# Patient Record
Sex: Male | Born: 1980
Health system: Southern US, Community
[De-identification: ages and names within clinical notes are randomized; demographics above are authoritative.]

---

## 2012-12-27 ENCOUNTER — Emergency Department (HOSPITAL_COMMUNITY)
Admission: EM | Admit: 2012-12-27 | Discharge: 2012-12-27 | Disposition: A | Discharge status: Home / Self Care | Payer: No Typology Code available for payment source | Attending: Emergency Medicine | Admitting: Emergency Medicine

## 2012-12-27 ENCOUNTER — Emergency Department (HOSPITAL_COMMUNITY): Payer: Self-pay

## 2012-12-27 ENCOUNTER — Encounter (HOSPITAL_COMMUNITY): Payer: Self-pay | Admitting: *Deleted

## 2012-12-27 ENCOUNTER — Emergency Department (HOSPITAL_COMMUNITY): Payer: No Typology Code available for payment source

## 2012-12-27 DIAGNOSIS — S60229A Contusion of unspecified hand, initial encounter: Secondary | ICD-10-CM

## 2012-12-27 DIAGNOSIS — S0993XA Unspecified injury of face, initial encounter: Secondary | ICD-10-CM | POA: Insufficient documentation

## 2012-12-27 DIAGNOSIS — S199XXA Unspecified injury of neck, initial encounter: Secondary | ICD-10-CM | POA: Insufficient documentation

## 2012-12-27 DIAGNOSIS — F17200 Nicotine dependence, unspecified, uncomplicated: Secondary | ICD-10-CM | POA: Insufficient documentation

## 2012-12-27 DIAGNOSIS — Y9389 Activity, other specified: Secondary | ICD-10-CM | POA: Insufficient documentation

## 2012-12-27 DIAGNOSIS — Y92410 Unspecified street and highway as the place of occurrence of the external cause: Secondary | ICD-10-CM | POA: Insufficient documentation

## 2012-12-27 MED ORDER — HYDROCODONE-ACETAMINOPHEN 5-325 MG PO TABS: 1.0000 | ORAL_TABLET | Freq: Four times a day (QID) | ORAL | Status: DC | PRN

## 2012-12-27 MED ORDER — OXYCODONE-ACETAMINOPHEN 5-325 MG PO TABS
1.0000 | ORAL_TABLET | Freq: Once | ORAL | Status: AC
  Administered 2012-12-27: 1 via ORAL
  Filled 2012-12-27: qty 1

## 2012-12-27 MED ORDER — IBUPROFEN 800 MG PO TABS: 800.0000 mg | ORAL_TABLET | Freq: Three times a day (TID) | ORAL | Status: DC | PRN
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
## 2012-12-27 NOTE — ED Notes (Signed)
Pt returned from xray
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
## 2012-12-28 NOTE — ED Provider Notes (Signed)
Medical screening examination/treatment/procedure(s) were performed by non-physician practitioner and as supervising physician I was immediately available for consultation/collaboration.  Jeronica Stlouis T Zeena Starkel, MD 12/28/12 1523 

## 2014-03-28 ENCOUNTER — Emergency Department (HOSPITAL_BASED_OUTPATIENT_CLINIC_OR_DEPARTMENT_OTHER)
Admission: EM | Admit: 2014-03-28 | Discharge: 2014-03-28 | Disposition: A | Discharge status: Home / Self Care | Payer: Self-pay | Attending: Emergency Medicine | Admitting: Emergency Medicine

## 2014-03-28 ENCOUNTER — Encounter (HOSPITAL_BASED_OUTPATIENT_CLINIC_OR_DEPARTMENT_OTHER): Payer: Self-pay | Admitting: Emergency Medicine

## 2014-03-28 ENCOUNTER — Emergency Department (HOSPITAL_BASED_OUTPATIENT_CLINIC_OR_DEPARTMENT_OTHER): Payer: Self-pay

## 2014-03-28 DIAGNOSIS — F17200 Nicotine dependence, unspecified, uncomplicated: Secondary | ICD-10-CM | POA: Insufficient documentation

## 2014-03-28 DIAGNOSIS — Y9389 Activity, other specified: Secondary | ICD-10-CM | POA: Insufficient documentation

## 2014-03-28 DIAGNOSIS — S62309A Unspecified fracture of unspecified metacarpal bone, initial encounter for closed fracture: Secondary | ICD-10-CM | POA: Insufficient documentation

## 2014-03-28 DIAGNOSIS — Y9289 Other specified places as the place of occurrence of the external cause: Secondary | ICD-10-CM | POA: Insufficient documentation

## 2014-03-28 DIAGNOSIS — S62304A Unspecified fracture of fourth metacarpal bone, right hand, initial encounter for closed fracture: Secondary | ICD-10-CM

## 2014-03-28 DIAGNOSIS — IMO0002 Reserved for concepts with insufficient information to code with codable children: Secondary | ICD-10-CM | POA: Insufficient documentation
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
## 2014-03-28 NOTE — Discharge Instructions (Signed)
Hand Fracture, Metacarpals °Fractures of metacarpals are breaks in the bones of the hand. They extend from the knuckles to the wrist. These bones can undergo many types of fractures. There are different ways of treating these fractures, all of which may be correct. °TREATMENT  °Hand fractures can be treated with:  °· Non-reduction - The fracture is casted without changing the positions of the fracture (bone pieces) involved. This fracture is usually left in a cast for 4 to 6 weeks or as your caregiver thinks necessary. °· Closed reduction - The bones are moved back into position without surgery and then casted. °· ORIF (open reduction and internal fixation) - The fracture site is opened and the bone pieces are fixed into place with some type of hardware, such as screws, etc. They are then casted. °Your caregiver will discuss the type of fracture you have and the treatment that should be best for that problem. If surgery is chosen, let your caregivers know about the following.  °LET YOUR CAREGIVERS KNOW ABOUT: °· Allergies. °· Medications you are taking, including herbs, eye drops, over the counter medications, and creams. °· Use of steroids (by mouth or creams). °· Previous problems with anesthetics or novocaine. °· Possibility of pregnancy. °· History of blood clots (thrombophlebitis). °· History of bleeding or blood problems. °· Previous surgeries. °· Other health problems. °AFTER THE PROCEDURE °After surgery, you will be taken to the recovery area where a nurse will watch and check your progress. Once you are awake, stable, and taking fluids well, barring other problems, you'll be allowed to go home. Once home, an ice pack applied to your operative site may help with pain and keep the swelling down. °HOME CARE INSTRUCTIONS  °· Follow your caregiver's instructions as to activities, exercises, physical therapy, and driving a car. °· Daily exercise is helpful for keeping range of motion and strength. Exercise as  instructed. °· To lessen swelling, keep the injured hand elevated above the level of your heart as much as possible. °· Apply ice to the injury for 15-20 minutes each hour while awake for the first 2 days. Put the ice in a plastic bag and place a thin towel between the bag of ice and your cast. °· Move the fingers of your casted hand several times a day. °· If a plaster or fiberglass cast was applied: °· Do not try to scratch the skin under the cast using a sharp or pointed object. °· Check the skin around the cast every day. You may put lotion on red or sore areas. °· Keep your cast dry. Your cast can be protected during bathing with a plastic bag. Do not put your cast into the water. °· If a plaster splint was applied: °· Wear your splint for as long as directed by your caregiver or until seen again. °· Do not get your splint wet. Protect it during bathing with a plastic bag. °· You may loosen the elastic bandage around the splint if your fingers start to get numb, tingle, get cold or turn blue. °· Do not put pressure on your cast or splint; this may cause it to break. Especially, do not lean plaster casts on hard surfaces for 24 hours after application. °· Take medications as directed by your caregiver. °· Only take over-the-counter or prescription medicines for pain, discomfort, or fever as directed by your caregiver. °· Follow-up as provided by your caregiver. This is very important in order to avoid permanent injury or disability and chronic   take over-the-counter or prescription medicines for pain, discomfort, or fever as directed by your caregiver.   Follow-up as provided by your caregiver. This is very important in order to avoid permanent injury or disability and chronic pain.  SEEK MEDICAL CARE IF:    Increased bleeding (more than a small spot) from beneath your cast or splint if there is beneath the cast as with an open reduction.   Redness, swelling, or increasing pain in the wound or from beneath your cast or splint.   Pus coming from wound or from beneath your cast or splint.   An unexplained oral temperature above 102 F (38.9 C) develops, or as your caregiver suggests.   A foul smell coming from the wound or dressing or from  beneath your cast or splint.   You have a problem moving any of your fingers.  SEEK IMMEDIATE MEDICAL CARE IF:    You develop a rash   You have difficulty breathing   You have any allergy problems  If you do not have a window in your cast for observing the wound, a discharge or minor bleeding may show up as a stain on the outside of your cast. Report these findings to your caregiver.  MAKE SURE YOU:    Understand these instructions.   Will watch your condition.   Will get help right away if you are not doing well or get worse.  Document Released: 11/05/2005 Document Revised: 01/28/2012 Document Reviewed: 06/24/2008  [employer] Patient Information 2014 [employer].

## 2014-03-28 NOTE — ED Notes (Signed)
MD at bedside. 

## 2014-03-28 NOTE — ED Provider Notes (Signed)
CSN: [id number]     Arrival date & time 03/28/14  1653 History  This chart was scribed for Jason Ryan [name], MD by Nicholos Johnsenise Iheanachor, ED scribe. This patient was seen in room [facility] and the patient's care was started at 5:37 PM.    Chief Complaint  Patient presents with  . Hand Injury   The history is provided by the patient. No language interpreter was used.   HPI Comments: Jason Ryan is a 33 y.o. male who presents to the Emergency Department complaining of right handed pain w/ associated swelling after he punched a wall 1 day prior. Does not report any other scratch or injury to the right hand. :Pt is right handed.  No past medical history on file. No past surgical history on file. No family history on file. History  Substance Use Topics  . Smoking status: Current Every Day Smoker  . Smokeless tobacco: Not on file  . Alcohol Use: Yes    Review of Systems  Musculoskeletal:       Right hand pain   A complete 10 system review of systems was obtained and all systems are negative except as noted in the HPI and PMH.  Allergies  Pork-derived products  Home Medications   Prior to Admission medications   Not on File   Triage Vitals: BP 116/69  Pulse 81  Temp(Src) 98.1 F (36.7 C) (Oral)  Resp 20  Ht 5\' 11"  (1.803 m)  Wt 140 lb (63.504 kg)  BMI 19.53 kg/m2  SpO2 98% Physical Exam  Nursing note and vitals reviewed. Constitutional: He is oriented to person, place, and time. He appears well-developed and well-nourished.  HENT:  Head: Normocephalic and atraumatic.  Neck: Neck supple.  Pulmonary/Chest: Effort normal.  Musculoskeletal:  Tender over 4th MCP joint.  Neurological: He is alert and oriented to person, place, and time. No cranial nerve deficit.  Psychiatric: He has a normal mood and affect. His behavior is normal.    ED Course  Procedures (including critical care time) DIAGNOSTIC STUDIES: Oxygen Saturation is 98% on room air, normal by my  interpretation.    COORDINATION OF CARE: At 5:39 PM: Discussed treatment plan with patient which includes immobilizing the hand with a splint and referral to an orthopedic specialist. Patient agrees.   Labs Review Labs Reviewed - No data to display  Imaging Review Dg Hand Complete Right  03/28/2014   CLINICAL DATA:  Traumatic injury and pain  EXAM: RIGHT HAND - COMPLETE 3+ VIEW  COMPARISON:  12/27/2012  FINDINGS: There is an angulated fracture through the distal fourth metacarpal with mild impaction at the fracture site. No other fractures are noted. No gross soft tissue abnormality is seen.  IMPRESSION: Distal fourth metacarpal fracture.   Electronically Signed   By: Alcide CleverMark  Lukens M.D.   On: 03/28/2014 17:22     EKG Interpretation None      MDM   Final diagnoses:  Fracture of fourth metacarpal bone of right hand    I personally performed the services described in this documentation, which was scribed in my presence. The recorded information has been reviewed and is accurate.       Emeril Stille [name], MD 03/28/14 [id number]

## 2018-09-11 ENCOUNTER — Other Ambulatory Visit: Payer: Self-pay

## 2018-09-11 ENCOUNTER — Encounter (HOSPITAL_BASED_OUTPATIENT_CLINIC_OR_DEPARTMENT_OTHER): Payer: Self-pay

## 2018-09-11 ENCOUNTER — Emergency Department (HOSPITAL_BASED_OUTPATIENT_CLINIC_OR_DEPARTMENT_OTHER)
Admission: EM | Admit: 2018-09-11 | Discharge: 2018-09-11 | Disposition: A | Discharge status: Home / Self Care | Payer: Self-pay | Attending: Emergency Medicine | Admitting: Emergency Medicine

## 2018-09-11 DIAGNOSIS — F17210 Nicotine dependence, cigarettes, uncomplicated: Secondary | ICD-10-CM | POA: Insufficient documentation

## 2018-09-11 DIAGNOSIS — N341 Nonspecific urethritis: Secondary | ICD-10-CM | POA: Insufficient documentation

## 2018-09-11 DIAGNOSIS — N342 Other urethritis: Secondary | ICD-10-CM

## 2018-09-11 MED ORDER — CEFTRIAXONE SODIUM 250 MG IJ SOLR
250.0000 mg | Freq: Once | INTRAMUSCULAR | Status: AC
  Administered 2018-09-11: 250 mg via INTRAMUSCULAR
  Filled 2018-09-11: qty 250

## 2018-09-11 MED ORDER — AZITHROMYCIN 250 MG PO TABS
1000.0000 mg | ORAL_TABLET | Freq: Once | ORAL | Status: AC
  Administered 2018-09-11: 1000 mg via ORAL
  Filled 2018-09-11: qty 4

## 2018-09-11 NOTE — Discharge Instructions (Signed)
Please read and follow all provided instructions.  Your diagnoses today include:  1. Urethritis     Tests performed today include:  Test for gonorrhea and chlamydia.   Test for HIV and syphilis.   You will be notified by telephone with any positive results.   Vital signs. See below for your results today.   Medications:  You were treated with azithromycin and rocephin today. These antibiotics treat you for gonorrhea and chlamydia. They do not treat for HIV or syphilis.   Home care instructions:  Read educational materials contained in this packet and follow any instructions provided.   You should tell your partners about your infection and avoid having sex for one week to allow time for the medicine to work.  Sexually transmitted disease testing also available at:   [facility], [facility]  [address] [place], phone [phone] or [phone]    Monday - Friday, call for an appointment  [facility], [facility]  [address], [place], phone [phone] or [phone]   Monday - Friday, call for an appointment  Return instructions:   Please return to the Emergency Department if you experience worsening symptoms.   Please return if you have any other emergent concerns.  Additional Information:  Your vital signs today were: BP 128/82 (BP Location: Left Arm)    Pulse 95    Temp 98.6 F (37 C) (Oral)    Resp 18    Ht 5\' 10"  (1.778 m)    Wt 59 kg    SpO2 97%    BMI 18.65 kg/m  If your blood pressure (BP) was elevated above 135/85 this visit, please have this repeated by your doctor within one month. --------------

## 2018-09-11 NOTE — ED Provider Notes (Signed)
[facility] EMERGENCY DEPARTMENT Provider Note   CSN: [id number] Arrival date & time: 09/11/18  1916     History   Chief Complaint Chief Complaint  Patient presents with  . Penile Discharge    HPI Jason Ryan is a 37 y.o. male.  Patient presents to the hospital with complaint of approximately 3 days of penile discharge and some discomfort with urination.  Patient reports unprotected sexual intercourse.  He is concerned about sexual transmitted infection and UTI.  No fevers, nausea, vomiting.  Patient reports having some diarrhea recently.  No treatments prior to arrival.  He denies lesions or sores in the groin area.  No sore throat.     History reviewed. No pertinent past medical history.  There are no active problems to display for this patient.   History reviewed. No pertinent surgical history.      Home Medications    Prior to Admission medications   Not on File    Family History No family history on file.  Social History Social History   Tobacco Use  . Smoking status: Current Every Day Smoker    Types: Cigarettes  . Smokeless tobacco: Never Used  Substance Use Topics  . Alcohol use: Yes    Comment: occ  . Drug use: No     Allergies   Pork-derived products   Review of Systems Review of Systems  Constitutional: Negative for fever.  HENT: Negative for sore throat.   Eyes: Negative for discharge.  Gastrointestinal: Negative for rectal pain.  Genitourinary: Positive for discharge and dysuria. Negative for frequency, genital sores, penile pain and testicular pain.  Musculoskeletal: Negative for arthralgias.  Skin: Negative for rash.  Hematological: Negative for adenopathy.     Physical Exam Updated Vital Signs BP 128/82 (BP Location: Left Arm)   Pulse 95   Temp 98.6 F (37 C) (Oral)   Resp 18   Ht 5\' 10"  (1.778 m)   Wt 59 kg   SpO2 97%   BMI 18.65 kg/m   Physical Exam  Constitutional: He appears well-developed and  well-nourished.  HENT:  Head: Normocephalic and atraumatic.  Eyes: Conjunctivae are normal.  Neck: Normal range of motion. Neck supple.  Pulmonary/Chest: No respiratory distress.  Genitourinary: Right testis shows no mass, no swelling and no tenderness. Left testis shows no mass, no swelling and no tenderness. Uncircumcised. Discharge (clear) found.  Neurological: He is alert.  Skin: Skin is warm and dry.  Psychiatric: He has a normal mood and affect.  Nursing note and vitals reviewed.    ED Treatments / Results  Labs (all labs ordered are listed, but only abnormal results are displayed) Labs Reviewed  RPR  HIV ANTIBODY (ROUTINE TESTING W REFLEX)  GC/CHLAMYDIA PROBE AMP ([place]) NOT AT [facility]    EKG None  Radiology No results found.  Procedures Procedures (including critical care time)  Medications Ordered in ED Medications  cefTRIAXone (ROCEPHIN) injection 250 mg (has no administration in time range)  azithromycin (ZITHROMAX) tablet 1,000 mg (has no administration in time range)     Initial Impression / Assessment and Plan / ED Course  I have reviewed the triage vital signs and the nursing notes.  Pertinent labs & imaging results that were available during my care of the patient were reviewed by me and considered in my medical decision making (see chart for details).     Patient seen and examined.  GC/chlamydia probe ordered and sent.  Offered HIV and RPR.  Patient agrees  to blood draw.  Vital signs reviewed and are as follows: BP 128/82 (BP Location: Left Arm)   Pulse 95   Temp 98.6 F (37 C) (Oral)   Resp 18   Ht 5\' 10"  (1.778 m)   Wt 59 kg   SpO2 97%   BMI 18.65 kg/m    Told them that they should not have sexual contact for next 7 days and that they need to inform sexual partners so that they can get tested and treated as well. Patient verbalizes understanding and agrees with plan.     Final Clinical Impressions(s) / ED Diagnoses   Final  diagnoses:  None   Patient with symptoms suspicious for urethritis.  Patient was tested and treated for [facility] and chlamydia tonight.  Also RPR and HIV testing pending.  ED Discharge Orders    None       Desmond Dike 09/11/18 2011    Benjiman Core, MD 09/11/18 [phone]

## 2018-09-11 NOTE — ED Triage Notes (Signed)
C/o penile d/c x 2-3 days-NAD-steady gait 

## 2018-09-12 LAB — GC/CHLAMYDIA PROBE AMP (~~LOC~~) NOT AT ARMC
Chlamydia: NEGATIVE
Neisseria Gonorrhea: NEGATIVE

## 2018-09-13 LAB — RPR: RPR: NONREACTIVE

## 2018-09-13 LAB — HIV ANTIBODY (ROUTINE TESTING W REFLEX): HIV Screen 4th Generation wRfx: NONREACTIVE

## 2019-01-13 ENCOUNTER — Emergency Department (HOSPITAL_BASED_OUTPATIENT_CLINIC_OR_DEPARTMENT_OTHER): Payer: Self-pay

## 2019-01-13 ENCOUNTER — Encounter (HOSPITAL_BASED_OUTPATIENT_CLINIC_OR_DEPARTMENT_OTHER): Payer: Self-pay | Admitting: *Deleted

## 2019-01-13 ENCOUNTER — Emergency Department (HOSPITAL_BASED_OUTPATIENT_CLINIC_OR_DEPARTMENT_OTHER)
Admission: EM | Admit: 2019-01-13 | Discharge: 2019-01-13 | Disposition: A | Discharge status: Home / Self Care | Payer: Self-pay | Attending: Emergency Medicine | Admitting: Emergency Medicine

## 2019-01-13 ENCOUNTER — Other Ambulatory Visit: Payer: Self-pay

## 2019-01-13 DIAGNOSIS — R6889 Other general symptoms and signs: Secondary | ICD-10-CM

## 2019-01-13 DIAGNOSIS — F17210 Nicotine dependence, cigarettes, uncomplicated: Secondary | ICD-10-CM | POA: Insufficient documentation

## 2019-01-13 DIAGNOSIS — J111 Influenza due to unidentified influenza virus with other respiratory manifestations: Secondary | ICD-10-CM | POA: Insufficient documentation

## 2019-01-13 DIAGNOSIS — R109 Unspecified abdominal pain: Secondary | ICD-10-CM | POA: Insufficient documentation

## 2019-01-13 LAB — URINALYSIS, ROUTINE W REFLEX MICROSCOPIC
GLUCOSE, UA: NEGATIVE mg/dL
Hgb urine dipstick: NEGATIVE
Ketones, ur: 15 mg/dL — AB
Leukocytes,Ua: NEGATIVE
Nitrite: NEGATIVE
PROTEIN: 30 mg/dL — AB
pH: 6 (ref 5.0–8.0)

## 2019-01-13 LAB — URINALYSIS, MICROSCOPIC (REFLEX)

## 2019-01-13 MED ORDER — ACETAMINOPHEN 325 MG PO TABS
650.0000 mg | ORAL_TABLET | Freq: Once | ORAL | Status: AC | PRN
  Administered 2019-01-13: 650 mg via ORAL
  Filled 2019-01-13 (×2): qty 2

## 2019-01-13 NOTE — ED Notes (Signed)
Patient transported to X-ray 

## 2019-01-13 NOTE — ED Triage Notes (Signed)
Cough, fever, "feel weak", x 2 days

## 2019-01-13 NOTE — Discharge Instructions (Addendum)
Today your symptoms are consistent with a flu or a flulike illness.  As I did not test you for the flu we call this a flulike illness.  I have given you information to read on the flu as most of this still applies.  If you have not already, please consider getting a flu shot once you are well.  Please make sure to practice good hand hygiene to help prevent the spread of flu.  We discussed today that many symptoms of the flu such as fever, not feeling well, and body aches can also be the first signs of more serious illnesses or infections.  If you have any new or worsening symptoms please seek additional medical care and evaluation.    You have urine culture and tests pending.  If you need treatment you will be contacted.   Please take Ibuprofen (Advil, motrin) and Tylenol (acetaminophen) to relieve your pain.  You may take up to 600 MG (3 pills) of normal strength ibuprofen every 8 hours as needed.  In between doses of ibuprofen you make take tylenol, up to 1,000 mg (two extra strength pills).  Do not take more than 3,000 mg tylenol in a 24 hour period.  Please check all medication labels as many medications such as pain and cold medications may contain tylenol.  Do not drink alcohol while taking these medications.  Do not take other NSAID'S while taking ibuprofen (such as aleve or naproxen).  Please take ibuprofen with food to decrease stomach upset.

## 2019-01-13 NOTE — ED Provider Notes (Signed)
[facility] EMERGENCY DEPARTMENT Provider Note   CSN: [id number] Arrival date & time: 01/13/19  1420    History   Chief Complaint Chief Complaint  Patient presents with  . Cough  . Chills    HPI Jason Ryan is a 38 y.o. male with a past medical history of tobacco abuse, who presents today for evaluation of cough, fever, chills, and generally feeling weak for the past 2 days.  He states his temperature has been as high as 101 at home.  He has been trying TheraFlu with out significant relief.  He did not get a flu shot this year.  He denies any abdominal pain, chest pain or shortness of breath.    He reports right sided low back pain.  Pain is in the upper part of his lower back.  He denies penile discharge.      HPI  History reviewed. No pertinent past medical history.  There are no active problems to display for this patient.   History reviewed. No pertinent surgical history.      Home Medications    Prior to Admission medications   Not on File    Family History No family history on file.  Social History Social History   Tobacco Use  . Smoking status: Current Every Day Smoker    Types: Cigarettes  . Smokeless tobacco: Never Used  Substance Use Topics  . Alcohol use: Yes    Comment: occ  . Drug use: No     Allergies   Pork-derived products   Review of Systems Review of Systems  Constitutional: Positive for chills, fatigue and fever.  HENT: Positive for congestion.   Respiratory: Positive for cough. Negative for chest tightness and shortness of breath.   Cardiovascular: Negative for chest pain.  Gastrointestinal: Negative for abdominal distention, abdominal pain, diarrhea, nausea and vomiting.  Genitourinary: Positive for flank pain (Flank/low back pain, right sided. ). Negative for dysuria.  Musculoskeletal: Positive for arthralgias and myalgias. Negative for neck pain and neck stiffness.  Skin: Negative for color change and rash.    All other systems reviewed and are negative.    Physical Exam Updated Vital Signs BP 109/71 (BP Location: Left Arm)   Pulse (!) 106   Temp (!) 100.5 F (38.1 C) (Oral)   Resp 17   Ht  (1.778 m)   Wt 59 kg   SpO2 97%   BMI 18.65 kg/m   Physical Exam Vitals signs and nursing note reviewed.  Constitutional:      General: He is not in acute distress.    Appearance: He is well-developed. He is not diaphoretic.  HENT:     Head: Normocephalic and atraumatic.     Right Ear: Tympanic membrane, ear canal and external ear normal.     Left Ear: Tympanic membrane, ear canal and external ear normal.     Nose: Nose normal. No congestion.     Mouth/Throat:     Mouth: Mucous membranes are moist.     Pharynx: No oropharyngeal exudate or posterior oropharyngeal erythema.  Eyes:     General: No scleral icterus.       Right eye: No discharge.        Left eye: No discharge.     Conjunctiva/sclera: Conjunctivae normal.  Neck:     Musculoskeletal: Normal range of motion and neck supple. No neck rigidity or muscular tenderness.  Cardiovascular:     Rate and Rhythm: Normal rate and regular rhythm.  Pulses: Normal pulses.     Heart sounds: Normal heart sounds.  Pulmonary:     Effort: Pulmonary effort is normal. No respiratory distress.     Breath sounds: No stridor. Rhonchi (Left lower field. ) present.  Abdominal:     General: Abdomen is flat. Bowel sounds are normal. There is no distension.     Tenderness: There is no abdominal tenderness. There is no right CVA tenderness, left CVA tenderness or guarding.  Musculoskeletal:        General: No deformity.     Comments: Right sided low back pain, unable to fully recreate on palpation.   Lymphadenopathy:     Cervical: No cervical adenopathy.  Skin:    General: Skin is warm and dry.  Neurological:     General: No focal deficit present.     Mental Status: He is alert.     Motor: No abnormal muscle tone.  Psychiatric:         Behavior: Behavior normal.      ED Treatments / Results  Labs (all labs ordered are listed, but only abnormal results are displayed) Labs Reviewed  URINALYSIS, ROUTINE W REFLEX MICROSCOPIC - Abnormal; Notable for the following components:      Result Value   APPearance HAZY (*)    Specific Gravity, Urine >1.030 (*)    Bilirubin Urine SMALL (*)    Ketones, ur 15 (*)    Protein, ur 30 (*)    All other components within normal limits  URINALYSIS, MICROSCOPIC (REFLEX) - Abnormal; Notable for the following components:   Bacteria, UA RARE (*)    All other components within normal limits  URINE CULTURE  GC/CHLAMYDIA PROBE AMP ([place]) NOT AT [facility]    EKG None  Radiology Dg Chest 2 View  Result Date: 01/13/2019 CLINICAL DATA:  38 year old male with a history of cough chills and fever EXAM: CHEST - 2 VIEW COMPARISON:  None. FINDINGS: The heart size and mediastinal contours are within normal limits. Both lungs are clear. Scoliotic curvature. IMPRESSION: Negative for acute cardiopulmonary disease Electronically Signed   By: Gilmer Mor D.O.   On: 01/13/2019 15:52    Procedures Procedures (including critical care time)  Medications Ordered in ED Medications  acetaminophen (TYLENOL) tablet 650 mg (650 mg Oral Given 01/13/19 1433)     Initial Impression / Assessment and Plan / ED Course  I have reviewed the triage vital signs and the nursing notes.  Pertinent labs & imaging results that were available during my care of the patient were reviewed by me and considered in my medical decision making (see chart for details).       Patient presents today for evaluation of 2 days of cough, fever chills and fatigue.  On arrival in the department he is febrile, mildly tachycardic.  He has not had any medicines today and was given Tylenol.  On physical exam he has slight rhonchi in the left lower lobe, chest x-ray was ordered showing no pneumonia or consolidation, suspect that this is  secondary to his smoking history.  He reported pain in his right lower back.  His abdomen is soft, nontender, nondistended, do not suspect intra-abdominal process.  He does not have any midline lower back pain or tenderness to palpation.  UA was ordered showing rare bacteria with 30 protein.  Patient states that he is circumcised, therefore would not expect him to have protein and bacteria in his urine normally.  Therefore urine is sent for culture and  gonorrhea chlamydia testing is ordered.  He was instructed on over-the-counter ibuprofen Tylenol along with conservative measures for influenza-like illness.  Work note given.  We discussed the risks and benefits of Tamiflu, he has had symptoms for 2 days and is otherwise healthy.  He was offered Tamiflu however after discussion he declined this.  Return precautions were discussed with patient who states their understanding.  At the time of discharge patient denied any unaddressed complaints or concerns.  Patient is agreeable for discharge home.   Final Clinical Impressions(s) / ED Diagnoses   Final diagnoses:  Flu-like symptoms  Right flank pain    ED Discharge Orders    None       Norman Clay 01/13/19 1616    Alvira Monday, MD 01/13/19 2315

## 2019-01-14 LAB — URINE CULTURE: CULTURE: NO GROWTH

## 2019-01-15 LAB — GC/CHLAMYDIA PROBE AMP (~~LOC~~) NOT AT ARMC
CHLAMYDIA, DNA PROBE: NEGATIVE
NEISSERIA GONORRHEA: NEGATIVE

## 2019-06-11 ENCOUNTER — Encounter (HOSPITAL_BASED_OUTPATIENT_CLINIC_OR_DEPARTMENT_OTHER): Payer: Self-pay | Admitting: *Deleted

## 2019-06-11 ENCOUNTER — Emergency Department (HOSPITAL_BASED_OUTPATIENT_CLINIC_OR_DEPARTMENT_OTHER)
Admission: EM | Admit: 2019-06-11 | Discharge: 2019-06-11 | Disposition: A | Discharge status: Home / Self Care | Payer: Self-pay | Attending: Emergency Medicine | Admitting: Emergency Medicine

## 2019-06-11 ENCOUNTER — Other Ambulatory Visit: Payer: Self-pay

## 2019-06-11 DIAGNOSIS — F17210 Nicotine dependence, cigarettes, uncomplicated: Secondary | ICD-10-CM | POA: Insufficient documentation

## 2019-06-11 DIAGNOSIS — N342 Other urethritis: Secondary | ICD-10-CM | POA: Insufficient documentation

## 2019-06-11 LAB — URINALYSIS, ROUTINE W REFLEX MICROSCOPIC
Bilirubin Urine: NEGATIVE
Glucose, UA: NEGATIVE mg/dL
Hgb urine dipstick: NEGATIVE
Ketones, ur: NEGATIVE mg/dL
Nitrite: NEGATIVE
Protein, ur: NEGATIVE mg/dL
Specific Gravity, Urine: 1.025 (ref 1.005–1.030)
pH: 6.5 (ref 5.0–8.0)

## 2019-06-11 LAB — URINALYSIS, MICROSCOPIC (REFLEX): RBC / HPF: NONE SEEN RBC/hpf (ref 0–5)

## 2019-06-11 MED ORDER — LIDOCAINE HCL (PF) 1 % IJ SOLN
INTRAMUSCULAR | Status: AC
  Administered 2019-06-11: 23:00:00 5 mL
  Filled 2019-06-11: qty 5

## 2019-06-11 MED ORDER — CEFTRIAXONE SODIUM 250 MG IJ SOLR
250.0000 mg | Freq: Once | INTRAMUSCULAR | Status: AC
  Administered 2019-06-11: 250 mg via INTRAMUSCULAR
  Filled 2019-06-11: qty 250

## 2019-06-11 MED ORDER — AZITHROMYCIN 250 MG PO TABS
1000.0000 mg | ORAL_TABLET | Freq: Once | ORAL | Status: AC
  Administered 2019-06-11: 23:00:00 1000 mg via ORAL
  Filled 2019-06-11: qty 4

## 2019-06-11 NOTE — ED Triage Notes (Addendum)
States he thinks he has a UTI. Urgency and scanty output.

## 2019-06-11 NOTE — ED Provider Notes (Signed)
[facility] Provider Note: Lowella DellJ. Lane Imoni Kohen, MD, FACEP  CSN: [id number] MRN: [id number] ARRIVAL: 06/11/19 at 2217 ROOM: MH04/[facility]04   CHIEF COMPLAINT  Dysuria   HISTORY OF PRESENT ILLNESS  06/11/19 10:49 PM Jason Ryan is a 38 y.o. male with a 2-day history of urinary frequency and voiding of small amounts.  He denies actual pain with urination.  He has noticed a watery discharge from his urethra.  He denies fever or chills.  He denies back pain.  He is sexually active but has had no known STD exposure.  He was treated for a similar complaint last year.  GC and Chlamydia were negative and urine culture grew nothing out.   History reviewed. No pertinent past medical history.  History reviewed. No pertinent surgical history.  No family history on file.  Social History   Tobacco Use  . Smoking status: Current Every Day Smoker    Types: Cigarettes  . Smokeless tobacco: Never Used  Substance Use Topics  . Alcohol use: Yes    Comment: occ  . Drug use: No    Prior to Admission medications   Not on File    Allergies Pork-derived products   REVIEW OF SYSTEMS  Negative except as noted here or in the History of Present Illness.   PHYSICAL EXAMINATION  Initial Vital Signs Blood pressure 121/79, pulse 80, temperature 98.5 F (36.9 C), temperature source Oral, resp. rate 20, height 5\' 10"  (1.778 m), weight 61.2 kg, SpO2 98 %.  Examination General: Well-developed, well-nourished male in no acute distress; appearance consistent with age of record HENT: normocephalic; atraumatic Eyes: pupils equal, round and reactive to light; extraocular muscles intact Neck: supple Heart: regular rate and rhythm Lungs: clear to auscultation bilaterally Abdomen: soft; nondistended; nontender; bowel sounds present GU: Tanner V male, circumcised; watery, slightly cloudy urethral discharge Extremities: No deformity; full range of motion; pulses normal Neurologic: Awake, alert and  oriented; motor function intact in all extremities and symmetric; no facial droop Skin: Warm and dry Psychiatric: Normal mood and affect   RESULTS  Summary of this visit's results, reviewed by myself:   EKG Interpretation  Date/Time:    Ventricular Rate:    PR Interval:    QRS Duration:   QT Interval:    QTC Calculation:   R Axis:     Text Interpretation:        Laboratory Studies: Results for orders placed or performed during the hospital encounter of 06/11/19 (from the past 24 hour(s))  Urinalysis, Routine w reflex microscopic     Status: Abnormal   Collection Time: 06/11/19 10:32 PM  Result Value Ref Range   Color, Urine YELLOW YELLOW   APPearance HAZY (A) CLEAR   Specific Gravity, Urine 1.025 1.005 - 1.030   pH 6.5 5.0 - 8.0   Glucose, UA NEGATIVE NEGATIVE mg/dL   Hgb urine dipstick NEGATIVE NEGATIVE   Bilirubin Urine NEGATIVE NEGATIVE   Ketones, ur NEGATIVE NEGATIVE mg/dL   Protein, ur NEGATIVE NEGATIVE mg/dL   Nitrite NEGATIVE NEGATIVE   Leukocytes,Ua TRACE (A) NEGATIVE  Urinalysis, Microscopic (reflex)     Status: Abnormal   Collection Time: 06/11/19 10:32 PM  Result Value Ref Range   RBC / HPF NONE SEEN 0 - 5 RBC/hpf   WBC, UA 11-20 0 - 5 WBC/hpf   Bacteria, UA RARE (A) NONE SEEN   Squamous Epithelial / LPF 0-5 0 - 5   Imaging Studies: No results found.  ED COURSE and MDM  Nursing  notes and initial vitals signs, including pulse oximetry, reviewed.  Vitals:   06/11/19 2226 06/11/19 2229  BP:  121/79  Pulse:  80  Resp:  20  Temp:  98.5 F (36.9 C)  TempSrc:  Oral  SpO2:  98%  Weight: 61.2 kg   Height: 5\' 10"  (1.778 m)    Will treat for GC and chlamydia as urethritis is most likely in his demographic group.  We will also send urine for culture.  PROCEDURES    ED DIAGNOSES     ICD-10-CM   1. Urethritis  N34.2        Oluwatomisin Hustead, MD 06/11/19 2310

## 2019-06-13 LAB — URINE CULTURE: Culture: NO GROWTH

## 2019-10-20 ENCOUNTER — Emergency Department (HOSPITAL_BASED_OUTPATIENT_CLINIC_OR_DEPARTMENT_OTHER): Payer: Self-pay

## 2019-10-20 ENCOUNTER — Other Ambulatory Visit: Payer: Self-pay

## 2019-10-20 ENCOUNTER — Encounter (HOSPITAL_BASED_OUTPATIENT_CLINIC_OR_DEPARTMENT_OTHER): Payer: Self-pay | Admitting: *Deleted

## 2019-10-20 ENCOUNTER — Inpatient Hospital Stay (HOSPITAL_BASED_OUTPATIENT_CLINIC_OR_DEPARTMENT_OTHER)
Admission: EM | Admit: 2019-10-20 | Discharge: 2019-10-22 | DRG: 917 | Disposition: A | Discharge status: Home / Self Care | Payer: Self-pay | Attending: Internal Medicine | Admitting: Internal Medicine

## 2019-10-20 DIAGNOSIS — D696 Thrombocytopenia, unspecified: Secondary | ICD-10-CM | POA: Diagnosis present

## 2019-10-20 DIAGNOSIS — R208 Other disturbances of skin sensation: Secondary | ICD-10-CM

## 2019-10-20 DIAGNOSIS — Z91018 Allergy to other foods: Secondary | ICD-10-CM

## 2019-10-20 DIAGNOSIS — Y902 Blood alcohol level of 40-59 mg/100 ml: Secondary | ICD-10-CM | POA: Diagnosis present

## 2019-10-20 DIAGNOSIS — I6340 Cerebral infarction due to embolism of unspecified cerebral artery: Secondary | ICD-10-CM | POA: Diagnosis present

## 2019-10-20 DIAGNOSIS — F40240 Claustrophobia: Secondary | ICD-10-CM | POA: Diagnosis present

## 2019-10-20 DIAGNOSIS — F1410 Cocaine abuse, uncomplicated: Secondary | ICD-10-CM | POA: Diagnosis present

## 2019-10-20 DIAGNOSIS — F17210 Nicotine dependence, cigarettes, uncomplicated: Secondary | ICD-10-CM | POA: Diagnosis present

## 2019-10-20 DIAGNOSIS — R200 Anesthesia of skin: Secondary | ICD-10-CM | POA: Diagnosis present

## 2019-10-20 DIAGNOSIS — G8321 Monoplegia of upper limb affecting right dominant side: Secondary | ICD-10-CM | POA: Diagnosis present

## 2019-10-20 DIAGNOSIS — T510X1A Toxic effect of ethanol, accidental (unintentional), initial encounter: Secondary | ICD-10-CM | POA: Diagnosis present

## 2019-10-20 DIAGNOSIS — R29898 Other symptoms and signs involving the musculoskeletal system: Secondary | ICD-10-CM

## 2019-10-20 DIAGNOSIS — I639 Cerebral infarction, unspecified: Secondary | ICD-10-CM | POA: Diagnosis present

## 2019-10-20 DIAGNOSIS — M6282 Rhabdomyolysis: Secondary | ICD-10-CM | POA: Diagnosis present

## 2019-10-20 DIAGNOSIS — G5631 Lesion of radial nerve, right upper limb: Secondary | ICD-10-CM

## 2019-10-20 DIAGNOSIS — F1020 Alcohol dependence, uncomplicated: Secondary | ICD-10-CM | POA: Diagnosis present

## 2019-10-20 DIAGNOSIS — Z20828 Contact with and (suspected) exposure to other viral communicable diseases: Secondary | ICD-10-CM | POA: Diagnosis present

## 2019-10-20 DIAGNOSIS — R29702 NIHSS score 2: Secondary | ICD-10-CM | POA: Diagnosis present

## 2019-10-20 DIAGNOSIS — R29810 Facial weakness: Secondary | ICD-10-CM | POA: Diagnosis present

## 2019-10-20 DIAGNOSIS — T405X1A Poisoning by cocaine, accidental (unintentional), initial encounter: Principal | ICD-10-CM | POA: Diagnosis present

## 2019-10-20 DIAGNOSIS — R7989 Other specified abnormal findings of blood chemistry: Secondary | ICD-10-CM | POA: Diagnosis present

## 2019-10-20 DIAGNOSIS — E860 Dehydration: Secondary | ICD-10-CM | POA: Diagnosis present

## 2019-10-20 LAB — URINALYSIS, MICROSCOPIC (REFLEX)

## 2019-10-20 LAB — HIV ANTIBODY (ROUTINE TESTING W REFLEX): HIV Screen 4th Generation wRfx: NONREACTIVE

## 2019-10-20 LAB — CBC WITH DIFFERENTIAL/PLATELET
Abs Immature Granulocytes: 0.02 10*3/uL (ref 0.00–0.07)
Basophils Absolute: 0 10*3/uL (ref 0.0–0.1)
Basophils Relative: 0 %
Eosinophils Absolute: 0 10*3/uL (ref 0.0–0.5)
Eosinophils Relative: 0 %
HCT: 43.7 % (ref 39.0–52.0)
Hemoglobin: 14.5 g/dL (ref 13.0–17.0)
Immature Granulocytes: 0 %
Lymphocytes Relative: 14 %
Lymphs Abs: 0.9 10*3/uL (ref 0.7–4.0)
MCH: 32.5 pg (ref 26.0–34.0)
MCHC: 33.2 g/dL (ref 30.0–36.0)
MCV: 98 fL (ref 80.0–100.0)
Monocytes Absolute: 0.4 10*3/uL (ref 0.1–1.0)
Monocytes Relative: 5 %
Neutro Abs: 5.5 10*3/uL (ref 1.7–7.7)
Neutrophils Relative %: 81 %
Platelets: 231 10*3/uL (ref 150–400)
RBC: 4.46 MIL/uL (ref 4.22–5.81)
RDW: 12.8 % (ref 11.5–15.5)
WBC: 6.9 10*3/uL (ref 4.0–10.5)
nRBC: 0 % (ref 0.0–0.2)

## 2019-10-20 LAB — COMPREHENSIVE METABOLIC PANEL
ALT: 105 U/L — ABNORMAL HIGH (ref 0–44)
AST: 256 U/L — ABNORMAL HIGH (ref 15–41)
Albumin: 4.5 g/dL (ref 3.5–5.0)
Alkaline Phosphatase: 69 U/L (ref 38–126)
Anion gap: 11 (ref 5–15)
BUN: 10 mg/dL (ref 6–20)
CO2: 23 mmol/L (ref 22–32)
Calcium: 8.8 mg/dL — ABNORMAL LOW (ref 8.9–10.3)
Chloride: 104 mmol/L (ref 98–111)
Creatinine, Ser: 0.93 mg/dL (ref 0.61–1.24)
GFR calc Af Amer: >60 mL/min (ref >60)
GFR calc non Af Amer: >60 mL/min (ref >60)
Glucose, Bld: 100 mg/dL — ABNORMAL HIGH (ref 70–99)
Potassium: 3.7 mmol/L (ref 3.5–5.1)
Sodium: 138 mmol/L (ref 135–145)
Total Bilirubin: 0.6 mg/dL (ref 0.3–1.2)
Total Protein: 7.3 g/dL (ref 6.5–8.1)

## 2019-10-20 LAB — RAPID URINE DRUG SCREEN, HOSP PERFORMED
Amphetamines: NOT DETECTED
Barbiturates: NOT DETECTED
Benzodiazepines: NOT DETECTED
Cocaine: POSITIVE — AB
Opiates: NOT DETECTED
Tetrahydrocannabinol: NOT DETECTED

## 2019-10-20 LAB — URINALYSIS, ROUTINE W REFLEX MICROSCOPIC
Bilirubin Urine: NEGATIVE
Glucose, UA: NEGATIVE mg/dL
Ketones, ur: NEGATIVE mg/dL
Leukocytes,Ua: NEGATIVE
Nitrite: NEGATIVE
Protein, ur: 30 mg/dL — AB
Specific Gravity, Urine: >1.03 — ABNORMAL HIGH (ref 1.005–1.030)
pH: 5.5 (ref 5.0–8.0)

## 2019-10-20 LAB — ETHANOL: Alcohol, Ethyl (B): 41 mg/dL — ABNORMAL HIGH (ref <10)

## 2019-10-20 MED ORDER — AZITHROMYCIN 250 MG PO TABS
1000.0000 mg | ORAL_TABLET | Freq: Once | ORAL | Status: AC
  Administered 2019-10-20: 1000 mg via ORAL
  Filled 2019-10-20: qty 4

## 2019-10-20 MED ORDER — LIDOCAINE HCL (PF) 1 % IJ SOLN
INTRAMUSCULAR | Status: AC
  Administered 2019-10-20: 5 mL
  Filled 2019-10-20: qty 5

## 2019-10-20 MED ORDER — SODIUM CHLORIDE 0.9 % IV BOLUS
1000.0000 mL | Freq: Once | INTRAVENOUS | Status: AC
  Administered 2019-10-20: 1000 mL via INTRAVENOUS

## 2019-10-20 MED ORDER — CEFTRIAXONE SODIUM 250 MG IJ SOLR
250.0000 mg | Freq: Once | INTRAMUSCULAR | Status: AC
  Administered 2019-10-20: 250 mg via INTRAMUSCULAR
  Filled 2019-10-20: qty 250

## 2019-10-20 NOTE — ED Triage Notes (Signed)
Received pt from [facility]

## 2019-10-20 NOTE — ED Notes (Signed)
Pt. Up to door asking for drink and food at present time.  Explained to the Pt. That we need to wait till he gets his MRI at [facility]

## 2019-10-20 NOTE — ED Provider Notes (Signed)
[facility] EMERGENCY DEPARTMENT Provider Note   CSN: [id number] Arrival date & time: 10/20/19  1730     History   Chief Complaint Chief Complaint  Patient presents with  . Numbness    HPI Beacher Seeling is a 38 y.o. male history of heavy cocaine and alcohol use     HPI Patient presents today with right arm numbness and weakness as well as left foot pins and needle sensation.  Patient states that he drank at least 20 shots of liquor and snorted half a gram of cocaine last night. Patient woke up today at 2pm feeling that his arm was numb and was unable to move his arm.  He states that he felt as though he fell asleep on it but states he slept on his back and woke up in this position. Denies known trauma.  States that he also noticed that his left foot felt numb.  States that upon waking up his left foot had felt "pins and needles" which has persisted along with arm weakness.  Patient denies any history of the same. States that he uses cocaine frequently, smokes every day, denies any marijuana use, and states that he drinks most days of the week and when he does he drinks approximately 20 or more shots of liquor.  States that he is eating at least once a day denies drinking water recently.   Additionally, patient states that he woke up this morning to find that his longtime friend was dead presumably due to overdose.  States that police are at his house at this time.  Patient is tearful during discussion.   Patient denies any chest pain, shortness of breath, palpitations.  States that he does feel intoxicated still or at least hung over.   History reviewed. No pertinent past medical history.  There are no active problems to display for this patient.   History reviewed. No pertinent surgical history.      Home Medications    Prior to Admission medications   Not on File    Family History No family history on file.  Social History Social History   Tobacco Use   . Smoking status: Current Every Day Smoker    Types: Cigarettes  . Smokeless tobacco: Never Used  Substance Use Topics  . Alcohol use: Yes    Comment: occ  . Drug use: Yes    Types: Cocaine     Allergies   Pork-derived products   Review of Systems Review of Systems  Constitutional: Negative for fever.  HENT: Negative for congestion.   Respiratory: Negative for cough, chest tightness and shortness of breath.   Cardiovascular: Negative for chest pain.  Gastrointestinal: Negative for abdominal pain.  Neurological: Positive for weakness (Right arm) and numbness (Right arm left foot). Negative for dizziness, tremors, seizures and syncope.  Psychiatric/Behavioral: Negative for confusion. The patient is nervous/anxious.        Anxiety, acute sadness  All other systems reviewed and are negative.    Physical Exam Updated Vital Signs BP 124/87   Pulse 75   Temp 98.7 F (37.1 C) (Oral)   Resp 18   Ht 5\' 10"  (1.778 m)   Wt 61.2 kg   SpO2 98%   BMI 19.37 kg/m   Physical Exam Vitals signs and nursing note reviewed.  Constitutional:      General: He is not in acute distress.    Comments: Patient is tearful, laying in bed talking to someone on phone when I enter  the room.  Generally disheveled, pleasant 38 year old male appears stated age  HENT:     Head: Normocephalic and atraumatic.     Nose: Nose normal.     Comments: Developing nasal septal perforation appears chronic.    Mouth/Throat:     Mouth: Mucous membranes are dry.  Eyes:     General: No scleral icterus.    Extraocular Movements: Extraocular movements intact.     Comments: Eyes are bloodshot.  EOMI.  Pupils appear 3 mm reactive to light.  Neck:     Musculoskeletal: Normal range of motion.  Cardiovascular:     Rate and Rhythm: Normal rate and regular rhythm.     Pulses: Normal pulses.     Heart sounds: Normal heart sounds.  Pulmonary:     Effort: Pulmonary effort is normal. No respiratory distress.      Breath sounds: No wheezing.  Abdominal:     Palpations: Abdomen is soft.     Tenderness: There is no abdominal tenderness.  Musculoskeletal:     Right lower leg: No edema.     Left lower leg: No edema.     Comments: Patient is able to move all 4 extremities other than right arm.  Left foot with intact sharp dull differentiation and light touch sensation.  All extremities with good cap refill and pulses.   Left shoulder with 5/5 abduction and adduction. Left elbow with 0/5 flexion/extention unable to resist gravity  Left hand with very weak flexion and extension of fingers    Skin:    General: Skin is warm and dry.     Capillary Refill: Capillary refill takes less than 2 seconds.     Comments: No track marks on arms.    Neurological:     Mental Status: He is alert. Mental status is at baseline.     Comments: Is alert and oriented able answer questions appropriately and follow commands.  Is anxious and tearful but is cooperative.   Right arm: Sensation is intact grossly and can differentiate sharp dull and light sensation.  Nail bed pressure causes withdrawal of arm after which patient is able to flex and extend fingers weakly.   Psychiatric:        Mood and Affect: Mood normal.        Behavior: Behavior normal.      ED Treatments / Results  Labs (all labs ordered are listed, but only abnormal results are displayed) Labs Reviewed  URINALYSIS, ROUTINE W REFLEX MICROSCOPIC - Abnormal; Notable for the following components:      Result Value   Specific Gravity, Urine >1.030 (*)    Hgb urine dipstick LARGE (*)    Protein, ur 30 (*)    All other components within normal limits  RAPID URINE DRUG SCREEN, HOSP PERFORMED - Abnormal; Notable for the following components:   Cocaine POSITIVE (*)    All other components within normal limits  COMPREHENSIVE METABOLIC PANEL - Abnormal; Notable for the following components:   Glucose, Bld 100 (*)    Calcium 8.8 (*)    AST 256 (*)     ALT 105 (*)    All other components within normal limits  ETHANOL - Abnormal; Notable for the following components:   Alcohol, Ethyl (B) 41 (*)    All other components within normal limits  URINALYSIS, MICROSCOPIC (REFLEX) - Abnormal; Notable for the following components:   Bacteria, UA RARE (*)    All other components within normal limits  CBC WITH DIFFERENTIAL/PLATELET  RPR  HIV ANTIBODY (ROUTINE TESTING W REFLEX)  VITAMIN B6  VITAMIN B1  GC/CHLAMYDIA PROBE AMP ([place]) NOT AT [facility]    EKG None  Radiology Dg Elbow Complete Right  Result Date: 10/20/2019 CLINICAL DATA:  Right elbow pain.  Right arm numbness. EXAM: RIGHT ELBOW - COMPLETE 3+ VIEW COMPARISON:  None. FINDINGS: There is no evidence of fracture, dislocation, or joint effusion. There is no evidence of arthropathy or other focal bone abnormality. Soft tissues are unremarkable. IMPRESSION: Negative radiographs of the right elbow. Electronically Signed   By: Narda RutherfordMelanie  Sanford M.D.   On: 10/20/2019 19:55   Ct Head Wo Contrast  Result Date: 10/20/2019 CLINICAL DATA:  38 year old male with right arm weakness. EXAM: CT HEAD WITHOUT CONTRAST TECHNIQUE: Contiguous axial images were obtained from the base of the skull through the vertex without intravenous contrast. COMPARISON:  None. FINDINGS: Brain: The ventricles and sulci appropriate size for patient's age. The gray-white matter discrimination is preserved. There is no acute intracranial hemorrhage. No mass effect or midline shift. No extra-axial fluid collection. Vascular: No hyperdense vessel or unexpected calcification. Skull: Normal. Negative for fracture or focal lesion. Sinuses/Orbits: No acute finding. Other: None IMPRESSION: Unremarkable noncontrast CT of the brain. Electronically Signed   By: Elgie CollardArash  Radparvar M.D.   On: 10/20/2019 19:42    Procedures Procedures (including critical care time)  Medications Ordered in ED Medications  cefTRIAXone (ROCEPHIN) injection  250 mg (250 mg Intramuscular Given 10/20/19 1854)  azithromycin (ZITHROMAX) tablet 1,000 mg (1,000 mg Oral Given 10/20/19 1854)  sodium chloride 0.9 % bolus 1,000 mL (1,000 mLs Intravenous New Bag/Given 10/20/19 1920)  lidocaine (PF) (XYLOCAINE) 1 % injection (5 mLs  Given 10/20/19 1854)     Initial Impression / Assessment and Plan / ED Course  I have reviewed the triage vital signs and the nursing notes.  Pertinent labs & imaging results that were available during my care of the patient were reviewed by me and considered in my medical decision making (see chart for details).        Patient presents today after heavy cocaine and alcohol use yesterday with weakness of left arm unable to support against gravity.   Patient seems to have some mild improvement of symptoms is now able to flex extend fingers.  However continues to have sensation changes in left foot and right arm with right arm weakness.  Discussed over the phone with Dr. Amada JupiterKirkpatrick who recommended [facility] of head and neck to rule out stroke.   CMP notable for AST elevation twice that of ALT.  Ethanol level currently 41.  No white count.  UDS positive for cocaine.  Urinalysis shows dehydration blood and protein.  Discussed with patient he will follow up with primary care doctor about these results.  He has no symptoms of abdominal pain or flank pain.  If repeat UAs in the future at PCP appointment shows blood he may require cystoscopy urology follow-up.  X-ray of right elbow without fracture or acute bony abnormality.  CT head WNL discussed with patient. He is [facility].      8:35 PM discussed with Dr. Silverio LayYao.  [facility] of neck and head without contrast.  Discussed with [facility] at [facility].  Patient is ready for transfer.   Also normal limits.  Patient has no chest pain or current pain.  Stable for transfer  to [facility].       Patient dispo at discretion of provider at [facility]. If MR negative for stroke suspect patient will be appropriate for FU with neurology OP and for recheck of urine with PCP.   Final Clinical Impressions(s) / ED Diagnoses   Final diagnoses:  None    ED Discharge Orders    None       Gailen Shelter, [place] 10/20/19 2209    Tegeler, Canary Brim, MD 10/21/19 0005

## 2019-10-20 NOTE — ED Notes (Signed)
Pt. Said he woke at 2pm with his R arm numb from the elbow down.  Pt. Jason Ryan he was partying last night and does not remember any trauma.  Pt. Does have noted knot to the R elbow outer side of elbow that he cant feel. Pt. Unable to hold R arm up but can use R hand some.

## 2019-10-20 NOTE — ED Notes (Signed)
Pt. Able to move the R hand and fingers on the R hand.  Pt. Has radial pulse bilat and warm skin to touch bilat. On each arm and each hand with no difference.  Pt. Is able to pick his R shoulder up with some difficulty.

## 2019-10-20 NOTE — ED Triage Notes (Signed)
Crying at triage. States a friend just died. They were drinking together last night. His friend did not wake up. States when he woke up his right arm was numb. Numbness to his left foot. He is ambulatory. Alert oriented.

## 2019-10-21 ENCOUNTER — Emergency Department (HOSPITAL_COMMUNITY): Payer: Self-pay

## 2019-10-21 ENCOUNTER — Inpatient Hospital Stay (HOSPITAL_COMMUNITY): Payer: Self-pay

## 2019-10-21 ENCOUNTER — Encounter (HOSPITAL_COMMUNITY): Payer: Self-pay | Admitting: Radiology

## 2019-10-21 DIAGNOSIS — R29898 Other symptoms and signs involving the musculoskeletal system: Secondary | ICD-10-CM

## 2019-10-21 DIAGNOSIS — D72819 Decreased white blood cell count, unspecified: Secondary | ICD-10-CM

## 2019-10-21 DIAGNOSIS — I639 Cerebral infarction, unspecified: Secondary | ICD-10-CM | POA: Diagnosis present

## 2019-10-21 DIAGNOSIS — F1410 Cocaine abuse, uncomplicated: Secondary | ICD-10-CM

## 2019-10-21 DIAGNOSIS — R748 Abnormal levels of other serum enzymes: Secondary | ICD-10-CM

## 2019-10-21 DIAGNOSIS — M6282 Rhabdomyolysis: Secondary | ICD-10-CM

## 2019-10-21 DIAGNOSIS — I6389 Other cerebral infarction: Secondary | ICD-10-CM

## 2019-10-21 DIAGNOSIS — R945 Abnormal results of liver function studies: Secondary | ICD-10-CM

## 2019-10-21 DIAGNOSIS — D696 Thrombocytopenia, unspecified: Secondary | ICD-10-CM

## 2019-10-21 DIAGNOSIS — R7989 Other specified abnormal findings of blood chemistry: Secondary | ICD-10-CM

## 2019-10-21 LAB — CBC WITH DIFFERENTIAL/PLATELET
Abs Immature Granulocytes: 0.02 10*3/uL (ref 0.00–0.07)
Basophils Absolute: 0 10*3/uL (ref 0.0–0.1)
Basophils Relative: 0 %
Eosinophils Absolute: 0.1 10*3/uL (ref 0.0–0.5)
Eosinophils Relative: 2 %
HCT: 40.2 % (ref 39.0–52.0)
Hemoglobin: 14 g/dL (ref 13.0–17.0)
Immature Granulocytes: 0 %
Lymphocytes Relative: 24 %
Lymphs Abs: 1.3 10*3/uL (ref 0.7–4.0)
MCH: 33.2 pg (ref 26.0–34.0)
MCHC: 34.8 g/dL (ref 30.0–36.0)
MCV: 95.3 fL (ref 80.0–100.0)
Monocytes Absolute: 0.5 10*3/uL (ref 0.1–1.0)
Monocytes Relative: 8 %
Neutro Abs: 3.6 10*3/uL (ref 1.7–7.7)
Neutrophils Relative %: 66 %
Platelets: 222 10*3/uL (ref 150–400)
RBC: 4.22 MIL/uL (ref 4.22–5.81)
RDW: 12.4 % (ref 11.5–15.5)
WBC: 5.4 10*3/uL (ref 4.0–10.5)
nRBC: 0 % (ref 0.0–0.2)

## 2019-10-21 LAB — CBC
HCT: 42.3 % (ref 39.0–52.0)
Hemoglobin: 14 g/dL (ref 13.0–17.0)
MCH: 33.2 pg (ref 26.0–34.0)
MCHC: 33.1 g/dL (ref 30.0–36.0)
MCV: 100.2 fL — ABNORMAL HIGH (ref 80.0–100.0)
Platelets: 41 10*3/uL — ABNORMAL LOW (ref 150–400)
RBC: 4.22 MIL/uL (ref 4.22–5.81)
RDW: 12.9 % (ref 11.5–15.5)
WBC: 3.9 10*3/uL — ABNORMAL LOW (ref 4.0–10.5)
nRBC: 0 % (ref 0.0–0.2)

## 2019-10-21 LAB — HEPATITIS PANEL, ACUTE
HCV Ab: NONREACTIVE
Hep A IgM: NONREACTIVE
Hep B C IgM: NONREACTIVE
Hepatitis B Surface Ag: NONREACTIVE

## 2019-10-21 LAB — COMPREHENSIVE METABOLIC PANEL
ALT: 79 U/L — ABNORMAL HIGH (ref 0–44)
AST: 157 U/L — ABNORMAL HIGH (ref 15–41)
Albumin: 3.3 g/dL — ABNORMAL LOW (ref 3.5–5.0)
Alkaline Phosphatase: 49 U/L (ref 38–126)
Anion gap: 10 (ref 5–15)
BUN: 7 mg/dL (ref 6–20)
CO2: 22 mmol/L (ref 22–32)
Calcium: 7.8 mg/dL — ABNORMAL LOW (ref 8.9–10.3)
Chloride: 108 mmol/L (ref 98–111)
Creatinine, Ser: 0.92 mg/dL (ref 0.61–1.24)
GFR calc Af Amer: >60 mL/min (ref >60)
GFR calc non Af Amer: >60 mL/min (ref >60)
Glucose, Bld: 75 mg/dL (ref 70–99)
Potassium: 3.8 mmol/L (ref 3.5–5.1)
Sodium: 140 mmol/L (ref 135–145)
Total Bilirubin: 0.9 mg/dL (ref 0.3–1.2)
Total Protein: 5.2 g/dL — ABNORMAL LOW (ref 6.5–8.1)

## 2019-10-21 LAB — SARS CORONAVIRUS 2 (TAT 6-24 HRS): SARS Coronavirus 2: NEGATIVE

## 2019-10-21 LAB — RPR: RPR Ser Ql: NONREACTIVE

## 2019-10-21 LAB — LIPID PANEL
Cholesterol: 149 mg/dL (ref 0–200)
HDL: 88 mg/dL (ref >40)
LDL Cholesterol: 53 mg/dL (ref 0–99)
Total CHOL/HDL Ratio: 1.7 RATIO
Triglycerides: 40 mg/dL (ref <150)
VLDL: 8 mg/dL (ref 0–40)

## 2019-10-21 LAB — ECHOCARDIOGRAM COMPLETE
Height: 70 in
Weight: 2160 oz

## 2019-10-21 LAB — MAGNESIUM: Magnesium: 1.7 mg/dL (ref 1.7–2.4)

## 2019-10-21 LAB — CK: Total CK: 6254 U/L — ABNORMAL HIGH (ref 49–397)

## 2019-10-21 LAB — HEMOGLOBIN A1C
Hgb A1c MFr Bld: 4.8 % (ref 4.8–5.6)
Mean Plasma Glucose: 91.06 mg/dL

## 2019-10-21 LAB — PHOSPHORUS: Phosphorus: 3.2 mg/dL (ref 2.5–4.6)

## 2019-10-21 MED ORDER — LORAZEPAM 2 MG/ML IJ SOLN
2.0000 mg | Freq: Once | INTRAMUSCULAR | Status: AC
  Administered 2019-10-21: 2 mg via INTRAVENOUS
  Filled 2019-10-21: qty 1

## 2019-10-21 MED ORDER — ADULT MULTIVITAMIN W/MINERALS CH
1.0000 | ORAL_TABLET | Freq: Every day | ORAL | Status: DC
  Administered 2019-10-21 – 2019-10-22 (×2): 1 via ORAL
  Filled 2019-10-21 (×2): qty 1

## 2019-10-21 MED ORDER — FOLIC ACID 1 MG PO TABS
1.0000 mg | ORAL_TABLET | Freq: Every day | ORAL | Status: DC
  Administered 2019-10-21 – 2019-10-22 (×2): 1 mg via ORAL
  Filled 2019-10-21 (×2): qty 1

## 2019-10-21 MED ORDER — ACETAMINOPHEN 160 MG/5ML PO SOLN: 650.0000 mg | ORAL | Status: DC | PRN

## 2019-10-21 MED ORDER — IOHEXOL 350 MG/ML SOLN
75.0000 mL | Freq: Once | INTRAVENOUS | Status: AC | PRN
  Administered 2019-10-21: 75 mL via INTRAVENOUS

## 2019-10-21 MED ORDER — LORAZEPAM 1 MG PO TABS: 1.0000 mg | ORAL_TABLET | ORAL | Status: DC | PRN

## 2019-10-21 MED ORDER — ASPIRIN EC 81 MG PO TBEC
81.0000 mg | DELAYED_RELEASE_TABLET | Freq: Every day | ORAL | Status: DC
  Administered 2019-10-21 – 2019-10-22 (×2): 81 mg via ORAL
  Filled 2019-10-21 (×2): qty 1

## 2019-10-21 MED ORDER — ASPIRIN 300 MG RE SUPP: 300.0000 mg | Freq: Every day | RECTAL | Status: DC

## 2019-10-21 MED ORDER — VITAMIN B-1 100 MG PO TABS
100.0000 mg | ORAL_TABLET | Freq: Every day | ORAL | Status: DC
  Administered 2019-10-21 – 2019-10-22 (×2): 100 mg via ORAL
  Filled 2019-10-21 (×2): qty 1

## 2019-10-21 MED ORDER — SODIUM CHLORIDE 0.9 % IV SOLN
INTRAVENOUS | Status: AC
  Administered 2019-10-21 (×2): via INTRAVENOUS

## 2019-10-21 MED ORDER — LORAZEPAM 2 MG/ML IJ SOLN: 1.0000 mg | INTRAMUSCULAR | Status: DC | PRN

## 2019-10-21 MED ORDER — THIAMINE HCL 100 MG/ML IJ SOLN
100.0000 mg | Freq: Every day | INTRAMUSCULAR | Status: DC
  Filled 2019-10-21: qty 2

## 2019-10-21 MED ORDER — CLOPIDOGREL BISULFATE 75 MG PO TABS
75.0000 mg | ORAL_TABLET | Freq: Every day | ORAL | Status: DC
  Administered 2019-10-21 – 2019-10-22 (×2): 75 mg via ORAL
  Filled 2019-10-21 (×2): qty 1

## 2019-10-21 MED ORDER — ACETAMINOPHEN 325 MG PO TABS: 650.0000 mg | ORAL_TABLET | ORAL | Status: DC | PRN

## 2019-10-21 MED ORDER — STROKE: EARLY STAGES OF RECOVERY BOOK
Freq: Once | Status: AC
  Administered 2019-10-21: 11:00:00
  Filled 2019-10-21: qty 1

## 2019-10-21 MED ORDER — ASPIRIN 325 MG PO TABS: 325.0000 mg | ORAL_TABLET | Freq: Every day | ORAL | Status: DC

## 2019-10-21 MED ORDER — LORAZEPAM 2 MG/ML IJ SOLN: 1.0000 mg | Freq: Once | INTRAMUSCULAR | Status: DC

## 2019-10-21 MED ORDER — ACETAMINOPHEN 650 MG RE SUPP: 650.0000 mg | RECTAL | Status: DC | PRN

## 2019-10-21 NOTE — Evaluation (Addendum)
Occupational Therapy Evaluation Patient Details Name: Jason Ryan MRN: [id number] DOB: 1981-06-20 Today's Date: 10/21/2019    History of Present Illness Pt is a 38 y.o. M with significant PMH of polysubstance abuse including cocaine, alcohol and tobacco who presents with difficulty moving RUE. MRI showing acute stroke involving left globus pallidus.    Clinical Impression   Pt admitted with the above diagnoses and presents with below problem list. Pt will benefit from continued acute OT to address the below listed deficits and maximize independence with basic ADLs prior to d/c home. At baseline, pt is independent with ADLs. Pt presents with RUE deficits in strength, coordination, sensation and proprioception. Pt currently setup to min A with UB ADLs. Pt endorses and observed to have difficulty holding items in dominant R hand. Issued built up foam for use utensils. Began initiating HEP for RUE but limited by arrival of Echo. Plan to further address HEP for RUE during next OT session.       Follow Up Recommendations  Outpatient OT    Equipment Recommendations  None recommended by OT    Recommendations for Other Services       Precautions / Restrictions Precautions Precautions: None Restrictions Weight Bearing Restrictions: No      Mobility Bed Mobility Overal bed mobility: Independent                Transfers Overall transfer level: Independent Equipment used: None                  Balance Overall balance assessment: Needs assistance Sitting-balance support: Feet supported Sitting balance-Leahy Scale: Normal     Standing balance support: No upper extremity supported;During functional activity Standing balance-Leahy Scale: Good Standing balance comment: narrow BOS.                            ADL either performed or assessed with clinical judgement   ADL Overall ADL's : Needs assistance/impaired Eating/Feeding: Set up;Supervision/  safety;Cueing for compensatory techinques;With adaptive utensils   Grooming: Oral care;Supervision/safety;Min guard;Standing;Cueing for compensatory techniques Grooming Details (indicate cue type and reason): using whole hand grasp to hold toothbrush, difficulty controlling finer movements. using L hand to assist in repositioning toothbrush in R hand Upper Body Bathing: Set up;Sitting   Lower Body Bathing: Supervison/ safety;Sit to/from stand;Min guard   Upper Body Dressing : Set up;Minimal assistance;Sitting Upper Body Dressing Details (indicate cue type and reason): assist with fasteners 2/2 decreased fine motor coordination Lower Body Dressing: Min guard;Sit to/from stand   Toilet Transfer: Supervision/safety;Min guard;Ambulation   Toileting- Clothing Manipulation and Hygiene: Min guard;Sit to/from stand   Tub/ Shower Transfer: Supervision/safety;Min guard   Functional mobility during ADLs: Supervision/safety;Min guard General ADL Comments: Discussed ADL strategies, and exercise HEP for RUE. issued builtup foam handle for utensils     Vision         Perception     Praxis      Pertinent Vitals/Pain Pain Assessment: No/denies pain     Hand Dominance Right   Extremity/Trunk Assessment Upper Extremity Assessment Upper Extremity Assessment: RUE deficits/detail RUE Deficits / Details: Pt reports he has been using nondminant L hand for feeding tasks. Issued builtup foam handle. Pt reports impaired sensation throughout RUE. Shoulder flexion 5/5, elbow flexion 4-/5, elbow extension 4/5. Pt reporting numbness distally, however, intact to light touch RUE Sensation: decreased proprioception;decreased light touch RUE Coordination: decreased fine motor   Lower Extremity Assessment Lower Extremity Assessment: Defer to  PT evaluation       Communication Communication Communication: No difficulties   Cognition Arousal/Alertness: Awake/alert Behavior During Therapy: WFL for tasks  assessed/performed Overall Cognitive Status: Within Functional Limits for tasks assessed                                     General Comments       Exercises Exercises: Other exercises Other Exercises Other Exercises: Issued level 1 theraband, hand held squeeze ball and educational handout on [facility] exercises. Also discussed incorporating proprioceptive exercises into routine.   Shoulder Instructions      Home Living Family/patient expects to be discharged to:: Private residence Living Arrangements: Parent;Other (Comment)(mom, dad, cousin) Available Help at Discharge: Family Type of Home: House Home Access: Stairs to enter Secretary/administrator of Steps: 3   Home Layout: One level                          Prior Functioning/Environment Level of Independence: Independent        Comments: Does not work        OT Problem List: Impaired balance (sitting and/or standing);Decreased knowledge of precautions;Impaired UE functional use;Impaired sensation      OT Treatment/Interventions: Self-care/ADL training;Therapeutic exercise;DME and/or AE instruction;Therapeutic activities;Patient/family education;Balance training    OT Goals(Current goals can be found in the care plan section) Acute Rehab OT Goals Patient Stated Goal: get strength back in arm OT Goal Formulation: With patient Time For Goal Achievement: 11/04/19 Potential to Achieve Goals: Good ADL Goals Pt Will Perform Eating: with modified independence;sitting Pt Will Perform Grooming: with modified independence;standing Pt Will Perform Upper Body Dressing: with modified independence;sitting Pt/caregiver will Perform Home Exercise Program: Increased strength;Right Upper extremity;With theraband;Independently;With written HEP provided  OT Frequency: Min 3X/week   Barriers to D/C:            Co-evaluation              AM-PAC OT "6 Clicks" Daily Activity     Outcome Measure Help from  another person eating meals?: A Little Help from another person taking care of personal grooming?: A Little Help from another person toileting, which includes using toliet, bedpan, or urinal?: None Help from another person bathing (including washing, rinsing, drying)?: None Help from another person to put on and taking off regular upper body clothing?: A Little Help from another person to put on and taking off regular lower body clothing?: None 6 Click Score: 21   End of Session    Activity Tolerance: Patient tolerated treatment well Patient left: in bed;with call bell/phone within reach;with family/visitor present;Other (comment)(with ECHO)  OT Visit Diagnosis: Hemiplegia and hemiparesis Hemiplegia - Right/Left: Right Hemiplegia - dominant/non-dominant: Dominant Hemiplegia - caused by: Cerebral infarction                Time: 2440-1027 OT Time Calculation (min): 18 min Charges:  OT General Charges $OT Visit: 1 Visit OT Evaluation $OT Eval Low Complexity: 1 Low  Raynald Kemp, OT Acute Rehabilitation Services Pager: [phone] Office: [phone]   Pilar Grammes 10/21/2019, 1:52 PM

## 2019-10-21 NOTE — Evaluation (Signed)
Physical Therapy Evaluation Patient Details Name: Jason Ryan MRN: [id number] DOB: 24-Apr-1981 Today's Date: 10/21/2019   History of Present Illness  Pt is a 38 y.o. M with significant PMH of polysubstance abuse including cocaine, alcohol and tobacco who presents with difficulty moving RUE. MRI showing acute stroke involving left globus pallidus.   Clinical Impression  Pt admitted with above. Pt presents with decreased functional mobility secondary to mild right arm weakness, sensation impairment (per pt; intact to light touch on assessment), and decreased coordination. Pt ambulating hallway distances with no assistive device at a supervision level and able to negotiate steps. Scoring 21/24 on Dynamic Gait Index, indicating he is not at high risk for falls. Would benefit from OPPT to address deficits and promote neuro recovery.      Follow Up Recommendations Outpatient PT (neuro)    Equipment Recommendations  None recommended by PT    Recommendations for Other Services OT consult     Precautions / Restrictions Precautions Precautions: None Restrictions Weight Bearing Restrictions: No      Mobility  Bed Mobility Overal bed mobility: Independent                Transfers Overall transfer level: Independent Equipment used: None                Ambulation/Gait Ambulation/Gait assistance: Supervision;Min guard Gait Distance (Feet): 400 Feet Assistive device: None Gait Pattern/deviations: Step-through pattern;Decreased stride length;Scissoring Gait velocity: decreased   General Gait Details: Pt initially at a min guard level for stability, progression to supervision level. Noted mild scissoring at times but able to self correct without physical assist from PT. No overt LOB. Pt reporting gait at baseline.   Stairs Stairs: Yes Stairs assistance: Supervision Stair Management: One rail Left Number of Stairs: 5 General stair comments: step over step  pattern  Wheelchair Mobility    Modified Rankin (Stroke Patients Only) Modified Rankin (Stroke Patients Only) Pre-Morbid Rankin Score: No symptoms Modified Rankin: Moderately severe disability     Balance Overall balance assessment: Needs assistance Sitting-balance support: Feet supported Sitting balance-Leahy Scale: Normal     Standing balance support: No upper extremity supported;During functional activity Standing balance-Leahy Scale: Good                   Standardized Balance Assessment Standardized Balance Assessment : Dynamic Gait Index   Dynamic Gait Index Level Surface: Mild Impairment Change in Gait Speed: Mild Impairment Gait with Horizontal Head Turns: Normal Gait with Vertical Head Turns: Normal Gait and Pivot Turn: Mild Impairment Step Over Obstacle: Normal Step Around Obstacles: Normal Steps: Normal Total Score: 21       Pertinent Vitals/Pain Pain Assessment: No/denies pain    Home Living Family/patient expects to be discharged to:: Private residence Living Arrangements: Parent;Other (Comment)(mom, dad, cousin) Available Help at Discharge: Family Type of Home: House Home Access: Stairs to enter   Technical brewer of Steps: 3 Home Layout: One level        Prior Function Level of Independence: Independent         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 Pt reporting numbness distally, however, intact to light touch RUE Sensation: WNL RUE Coordination: decreased fine motor LUE Deficits / Details: Strength 5/5    Lower Extremity Assessment Lower Extremity  Assessment: RLE deficits/detail;LLE deficits/detail RLE Deficits / Details: Strength 5/5 RLE Sensation: WNL LLE Deficits / Details: Strength  5/5 LLE Sensation: WNL    Cervical / Trunk Assessment Cervical / Trunk Assessment: Normal  Communication   Communication: No difficulties  Cognition Arousal/Alertness: Awake/alert Behavior During Therapy: WFL for tasks assessed/performed Overall Cognitive Status: Within Functional Limits for tasks assessed                                        General Comments      Exercises     Assessment/Plan    PT Assessment Patient needs continued PT services  PT Problem List Decreased strength;Decreased range of motion;Decreased balance;Decreased mobility;Decreased coordination;Impaired sensation       PT Treatment Interventions Gait training;Stair training;Functional mobility training;Therapeutic activities;Therapeutic exercise;Balance training;Patient/family education    PT Goals (Current goals can be found in the Care Plan section)  Acute Rehab PT Goals Patient Stated Goal: get strength back in arm PT Goal Formulation: With patient Time For Goal Achievement: 11/04/19 Potential to Achieve Goals: Good    Frequency Min 4X/week   Barriers to discharge        Co-evaluation               AM-PAC PT "6 Clicks" Mobility  Outcome Measure Help needed turning from your back to your side while in a flat bed without using bedrails?: None Help needed moving from lying on your back to sitting on the side of a flat bed without using bedrails?: None Help needed moving to and from a bed to a chair (including a wheelchair)?: None Help needed standing up from a chair using your arms (e.g., wheelchair or bedside chair)?: None Help needed to walk in hospital room?: A Little Help needed climbing 3-5 steps with a railing? : None 6 Click Score: 23    End of Session Equipment Utilized During Treatment: Gait belt Activity Tolerance: Patient tolerated treatment well Patient left: in bed;with call bell/phone within reach;with bed alarm set;with family/visitor present Nurse  Communication: Mobility status PT Visit Diagnosis: Unsteadiness on feet (R26.81);Other symptoms and signs involving the nervous system (R29.898)    Time: 3009-2330 PT Time Calculation (min) (ACUTE ONLY): 16 min   Charges:   PT Evaluation $PT Eval Low Complexity: 1 Low          Laurina Bustle, PT, DPT Acute Rehabilitation Services Pager [phone] Office [phone]   Vanetta Mulders 10/21/2019, 10:27 AM

## 2019-10-21 NOTE — Progress Notes (Signed)
Care started prior to midnight in the emergency room and the patient was admitted early this morning after midnight and I am in current agreement with assessment and plan done by Dr. Gean Birchwood.  Additional changes to the plan of care been made accordingly.  Patient is a 38 year old African-American male with a past medical history significant for but not limited to polysubstance abuse including cocaine, alcohol tobacco abuse as well as other comorbid conditions who presented to the emergency room with chief complaint of right extremity weakness and numbness.  Patient found it was difficult to move his right upper extremity since yesterday morning.  The night before he had some cocaine alcohol and he woke up with weakness which persisted prompting him to come to the emergency room for further evaluation.  Denies any weakness of any other extremities or difficulty speaking or swallowing.  In the ED he was found to have a strength of 4-5 out of extremities transferred to [facility] for ruling out a stroke.  An MRI of the brain was done showed no acute CVA involving the left globus pallidus.  He was admitted for further management and evaluation and neurology was consulted for assistance with management.  He underwent a CT angiogram of the head and neck and a UDS was positive for acute cocaine.  LFTs were elevated in the setting of alcoholism and recent alcohol abuse but have started trending down.  He was admitted for treatment of the following but not limited to:  Acute CVA in the setting of using cocaine -Confirmed on MRI as above -CTA of the head and neck done -Appreciate neurology consult.  2D echo is pending.   -Check hemoglobin A1c and lipid panel. -Lipid panel done and showed a cholesterol level of 149, HDL of 88, LDL 53, triglycerides of 40, VLDL of 8 -Number globin A1c was 4.8 -Bedside Swallow evaluation  -Neurochecks per protocol -Therapy Consults with Physical Therapy, Occupational Therapy  and SLP -Continue with prophylactic antiplatelet medication with aspirin 325 milligrams p.o. or 300 mg PR; defer to neurology to start clopidogrel -Continued with stratification modification -Continue telemetry -Further care per stroke team and further evaluation -Allow for permissive hypertension -Of note SARS-CoV-2 pending  Mild Rhabdomyolysis/Eelevated CK level in the setting of cocaine abuse -Continue IV fluid hydration with normal saline at a rate of 75 mL's per hour for at least 24 hours and repeat CK in the a.m.  Abnormal LFTs/Elevated LFTs likely from alcoholism. -Check acute hepatitis panel this was negative -Patient's AST went from 256 is now 157 and ALT went from 105 and is now 79 -Continue to monitor trend renal function -Repeat CMP in a.m.  Polysubstance abuse including Alcoholism, Cocaine, and Tobacco Abuse  -Counseling given and advised about quitting  -CIWA protocol.  Thrombocytopenia -Likely spurious result -Patient's platelet count went from 231 is now 41 -Repeat CBC now  Leukopenia -Patient's WBC of 6.9 is now 3.9 -Repeat CBC now  We will continue to monitor patient's clinical response to intervention and repeat blood work and imaging studies in a.m. to follow-up on neurology recommendations

## 2019-10-21 NOTE — ED Provider Notes (Addendum)
Patient transferred here for evaluation of right arm weakness.  Patient reports that he was using cocaine and drinking heavily last night, woke up unable to move his right arm.  Examination reveals preserved flexion with absent ability to extend at the wrist and fingers consistent with radial nerve palsy.  Patient sent to radiology to have the MRIs performed that were recommended by neurology.  Patient became claustrophobic and refused MRI.  I had a lengthy conversation with the patient, offering sedation prior to repeat imaging but he declines.  I did discuss with him that I felt that this was a reversible radial nerve palsy, but without the MRI cannot rule out a more serious problem such as stroke, cervical spine lesion, brachial plexus injury, etc.    Patient initially indicated that he would not follow through with any further imaging.  The initial partial head MRI, however showed possibility of stroke.  This was discussed with the patient and he did consent to further imaging with sedation.  Dr. Leonel Ramsay has evaluated the patient, recommends admission.  Orpah Greek, MD 10/21/19 0139    Orpah Greek, MD 10/21/19 [id number]    Orpah Greek, MD 10/21/19 [phone]

## 2019-10-21 NOTE — Discharge Instructions (Signed)
Return to this emergency department if you have any worsening symptoms.  Contact one of the above listed neurology groups for follow-up.

## 2019-10-21 NOTE — ED Notes (Signed)
Neurologist at the bedside 

## 2019-10-21 NOTE — H&P (Signed)
History and Physical    Jason Ryan KGM:[id number] DOB: [birth year] DOA: 10/20/2019  PCP: Default, Provider, MD  Patient coming from: Home.  Chief Complaint: Right upper extremity weakness.  HPI: Jason Ryan is a 38 y.o. male with history of polysubstance abuse including cocaine alcohol and tobacco was finding it difficult to move the right upper extremity since yesterday morning.  Night before that he had some cocaine and alcohol.  He woke up with his weakness yesterday morning which persisted and decided to come to the ER.  Denies any weakness of other extremities or any difficulty speaking or swallowing or visual symptoms.  Denies any headache.  ED Course: In the ER patient was found to have a strength of 4 x 5 of the right upper extremity.  Was transferred to [facility] for ruling out stroke.  MRI brain shows acute stroke involving the left globus pallidus.  Patient was admitted for further management.  On-call neurology has been consulted.  Labs show COVID-19 was negative.  EKG is pending.  CT angiogram of the head and neck just shows a stroke otherwise unremarkable.  Urine drug screen positive for cocaine.  Lab work show elevated LFTs with AST more than ALT compatible alcoholism.  Review of Systems: As per HPI, rest all negative.   History reviewed. No pertinent past medical history.  History reviewed. No pertinent surgical history.   reports that he has been smoking cigarettes. He has never used smokeless tobacco. He reports current alcohol use. He reports current drug use. Drug: Cocaine.  Allergies  Allergen Reactions   Pork-Derived Products Other (See Comments)    Patient preference    Family History  Family history unknown: Yes    Prior to Admission medications   Not on File    Physical Exam: Constitutional: Moderately built and nourished. Vitals:   10/21/19 0230 10/21/19 0245 10/21/19 0330 10/21/19 0345  BP: 114/82 123/82 130/83 123/85  Pulse: 70 78 78 78    Resp:      Temp:      TempSrc:      SpO2: 99% 99% 100% 98%  Weight:      Height:       Eyes: Anicteric no pallor. ENMT: No discharge from the ears eyes nose or mouth. Neck: No mass felt.  No neck rigidity. Respiratory: No rhonchi or crepitations. Cardiovascular: S1-S2 heard. Abdomen: Soft nontender bowel sounds present. Musculoskeletal: No edema.  No joint effusion. Skin: No rash. Neurologic: Alert awake oriented time place and person.  Right upper extremities 4 x 5.  Rest of extremities are 5 x 5.  No facial asymmetry tongue is midline.  Pupils are equal reacting to light. Psychiatric: Appears normal.   Labs on Admission: I have personally reviewed following labs and imaging studies  CBC: Recent Labs  Lab 10/20/19 1852  WBC 6.9  NEUTROABS 5.5  HGB 14.5  HCT 43.7  MCV 98.0  PLT 231   Basic Metabolic Panel: Recent Labs  Lab 10/20/19 1852  NA 138  K 3.7  CL 104  CO2 23  GLUCOSE 100*  BUN 10  CREATININE 0.93  CALCIUM 8.8*   GFR: Estimated Creatinine Clearance: 94.1 mL/min (by C-G formula based on SCr of 0.93 mg/dL). Liver Function Tests: Recent Labs  Lab 10/20/19 1852  AST 256*  ALT 105*  ALKPHOS 69  BILITOT 0.6  PROT 7.3  ALBUMIN 4.5   No results for input(s): LIPASE, AMYLASE in the last 168 hours. No results for input(s): AMMONIA in  the last 168 hours. Coagulation Profile: No results for input(s): INR, PROTIME in the last 168 hours. Cardiac Enzymes: No results for input(s): CKTOTAL, CKMB, CKMBINDEX, TROPONINI in the last 168 hours. BNP (last 3 results) No results for input(s): PROBNP in the last 8760 hours. HbA1C: No results for input(s): HGBA1C in the last 72 hours. CBG: No results for input(s): GLUCAP in the last 168 hours. Lipid Profile: No results for input(s): CHOL, HDL, LDLCALC, TRIG, CHOLHDL, LDLDIRECT in the last 72 hours. Thyroid Function Tests: No results for input(s): TSH, T4TOTAL, FREET4, T3FREE, THYROIDAB in the last 72  hours. Anemia Panel: No results for input(s): VITAMINB12, FOLATE, FERRITIN, TIBC, IRON, RETICCTPCT in the last 72 hours. Urine analysis:    Component Value Date/Time   COLORURINE YELLOW 10/20/2019 1839   APPEARANCEUR CLEAR 10/20/2019 1839   LABSPEC >1.030 (H) 10/20/2019 1839   PHURINE 5.5 10/20/2019 1839   GLUCOSEU NEGATIVE 10/20/2019 1839   HGBUR LARGE (A) 10/20/2019 1839   BILIRUBINUR NEGATIVE 10/20/2019 1839   KETONESUR NEGATIVE 10/20/2019 1839   PROTEINUR 30 (A) 10/20/2019 1839   NITRITE NEGATIVE 10/20/2019 1839   LEUKOCYTESUR NEGATIVE 10/20/2019 1839   Sepsis Labs: @LABRCNTIP (procalcitonin:4,lacticidven:4) )No results found for this or any previous visit (from the past 240 hour(s)).   Radiological Exams on Admission: Ct Angio Head W Or Wo Contrast  Result Date: 10/21/2019 CLINICAL DATA:  Acute nonhemorrhagic infarct of the left globus pallidus. New onset right arm weakness. Cocaine and alcohol use. EXAM: CT ANGIOGRAPHY HEAD AND NECK TECHNIQUE: Multidetector CT imaging of the head and neck was performed using the standard protocol during bolus administration of intravenous contrast. Multiplanar CT image reconstructions and MIPs were obtained to evaluate the vascular anatomy. Carotid stenosis measurements (when applicable) are obtained utilizing NASCET criteria, using the distal internal carotid diameter as the denominator. CONTRAST:  15mL OMNIPAQUE IOHEXOL 350 MG/ML SOLN COMPARISON:  None. MR head without contrast 10/21/2019 FINDINGS: CT HEAD FINDINGS Brain: Acute nonhemorrhagic infarct involving the left globus pallidus and genu of the internal capsule is again noted. No other acute infarct, hemorrhage, or mass lesion is present. White matter is otherwise within normal limits. No other focal cortical abnormalities are present. The ventricles are of normal size. No significant extraaxial fluid collection is present. The brainstem and cerebellum are within normal limits. Vascular: No  hyperdense vessel or unexpected calcification. Skull: Normal. Negative for fracture or focal lesion. Sinuses: The paranasal sinuses and mastoid air cells are clear. Orbits: The globes and orbits are within normal limits. Review of the MIP images confirms the above findings CTA NECK FINDINGS Aortic arch: A 3 vessel arch configuration is present. No significant atherosclerotic disease or stenosis is present. Right carotid system: The right common carotid artery is within normal limits. Bifurcation is unremarkable. Cervical right ICA is normal. Left carotid system: The left common carotid artery is within normal limits. The bifurcation is unremarkable. Cervical left ICA is normal. Vertebral arteries: The left vertebral artery is dominant. Both vertebral arteries originate from the subclavian arteries. There is no significant stenosis of either vertebral artery in the neck. Skeleton: Patient is scratched at the patient's head is rotated to the left. No focal osseous lesions are present. Alignment is anatomic. Other neck: Soft tissues of the neck are otherwise within normal limits. Upper chest: The lung apices are clear. Review of the MIP images confirms the above findings CTA HEAD FINDINGS Anterior circulation: Internal carotid arteries are within normal limits from the skull base through the ICA termini bilaterally. The  A1 and M1 segments are normal. The anterior communicating artery is patent. MCA bifurcations are intact. The ACA and MCA branch vessels are within normal limits. Posterior circulation: The left vertebral artery is the dominant vessel. PICA origins are visualized and normal. Vertebrobasilar junction is normal. The basilar artery is normal. Both posterior cerebral arteries originate from the basilar tip. PCA branch vessels are within normal limits. Venous sinuses: Dural sinuses are patent. The straight sinus deep cerebral veins are intact. Cortical veins are unremarkable. Anatomic variants: None Review of  the MIP images confirms the above findings IMPRESSION: 1. Stable appearance of acute nonhemorrhagic infarct involving the left globus pallidus and genu of the internal capsule. 2. Normal CTA Circle of Willis without significant proximal stenosis, aneurysm, or branch vessel occlusion. 3. Normal CTA of the neck. Electronically Signed   By: Marin Roberts M.D.   On: 10/21/2019 04:50   Dg Elbow Complete Right  Result Date: 10/20/2019 CLINICAL DATA:  Right elbow pain.  Right arm numbness. EXAM: RIGHT ELBOW - COMPLETE 3+ VIEW COMPARISON:  None. FINDINGS: There is no evidence of fracture, dislocation, or joint effusion. There is no evidence of arthropathy or other focal bone abnormality. Soft tissues are unremarkable. IMPRESSION: Negative radiographs of the right elbow. Electronically Signed   By: Narda Rutherford M.D.   On: 10/20/2019 19:55   Ct Head Wo Contrast  Result Date: 10/20/2019 CLINICAL DATA:  38 year old male with right arm weakness. EXAM: CT HEAD WITHOUT CONTRAST TECHNIQUE: Contiguous axial images were obtained from the base of the skull through the vertex without intravenous contrast. COMPARISON:  None. FINDINGS: Brain: The ventricles and sulci appropriate size for patient's age. The gray-white matter discrimination is preserved. There is no acute intracranial hemorrhage. No mass effect or midline shift. No extra-axial fluid collection. Vascular: No hyperdense vessel or unexpected calcification. Skull: Normal. Negative for fracture or focal lesion. Sinuses/Orbits: No acute finding. Other: None IMPRESSION: Unremarkable noncontrast CT of the brain. Electronically Signed   By: Elgie Collard M.D.   On: 10/20/2019 19:42   Ct Angio Neck W Or Wo Contrast  Result Date: 10/21/2019 CLINICAL DATA:  Acute nonhemorrhagic infarct of the left globus pallidus. New onset right arm weakness. Cocaine and alcohol use. EXAM: CT ANGIOGRAPHY HEAD AND NECK TECHNIQUE: Multidetector CT imaging of the head and neck  was performed using the standard protocol during bolus administration of intravenous contrast. Multiplanar CT image reconstructions and MIPs were obtained to evaluate the vascular anatomy. Carotid stenosis measurements (when applicable) are obtained utilizing NASCET criteria, using the distal internal carotid diameter as the denominator. CONTRAST:  75mL OMNIPAQUE IOHEXOL 350 MG/ML SOLN COMPARISON:  None. MR head without contrast 10/21/2019 FINDINGS: CT HEAD FINDINGS Brain: Acute nonhemorrhagic infarct involving the left globus pallidus and genu of the internal capsule is again noted. No other acute infarct, hemorrhage, or mass lesion is present. White matter is otherwise within normal limits. No other focal cortical abnormalities are present. The ventricles are of normal size. No significant extraaxial fluid collection is present. The brainstem and cerebellum are within normal limits. Vascular: No hyperdense vessel or unexpected calcification. Skull: Normal. Negative for fracture or focal lesion. Sinuses: The paranasal sinuses and mastoid air cells are clear. Orbits: The globes and orbits are within normal limits. Review of the MIP images confirms the above findings CTA NECK FINDINGS Aortic arch: A 3 vessel arch configuration is present. No significant atherosclerotic disease or stenosis is present. Right carotid system: The right common carotid artery is within normal limits.  Bifurcation is unremarkable. Cervical right ICA is normal. Left carotid system: The left common carotid artery is within normal limits. The bifurcation is unremarkable. Cervical left ICA is normal. Vertebral arteries: The left vertebral artery is dominant. Both vertebral arteries originate from the subclavian arteries. There is no significant stenosis of either vertebral artery in the neck. Skeleton: Patient is scratched at the patient's head is rotated to the left. No focal osseous lesions are present. Alignment is anatomic. Other neck: Soft  tissues of the neck are otherwise within normal limits. Upper chest: The lung apices are clear. Review of the MIP images confirms the above findings CTA HEAD FINDINGS Anterior circulation: Internal carotid arteries are within normal limits from the skull base through the ICA termini bilaterally. The A1 and M1 segments are normal. The anterior communicating artery is patent. MCA bifurcations are intact. The ACA and MCA branch vessels are within normal limits. Posterior circulation: The left vertebral artery is the dominant vessel. PICA origins are visualized and normal. Vertebrobasilar junction is normal. The basilar artery is normal. Both posterior cerebral arteries originate from the basilar tip. PCA branch vessels are within normal limits. Venous sinuses: Dural sinuses are patent. The straight sinus deep cerebral veins are intact. Cortical veins are unremarkable. Anatomic variants: None Review of the MIP images confirms the above findings IMPRESSION: 1. Stable appearance of acute nonhemorrhagic infarct involving the left globus pallidus and genu of the internal capsule. 2. Normal CTA Circle of Willis without significant proximal stenosis, aneurysm, or branch vessel occlusion. 3. Normal CTA of the neck. Electronically Signed   By: Marin Robertshristopher  Mattern M.D.   On: 10/21/2019 04:50   Mr Brain Wo Contrast (neuro Protocol)  Result Date: 10/21/2019 CLINICAL DATA:  38 year old male with right arm weakness. Cocaine and alcohol use. EXAM: MRI HEAD WITHOUT CONTRAST TECHNIQUE: Multiplanar, multiecho pulse sequences of the brain and surrounding structures were obtained without intravenous contrast. COMPARISON:  Head CT earlier tonight. FINDINGS: The examination had to be discontinued prior to completion due to claustrophobia. Axial and coronal DWI as well as sagittal T1 and axial T2 imaging was obtained. Brain: Punctate restricted diffusion in the left globus pallidus on series 5, image 71 and series 7, image 53. Minimal  associated T2 hyperintensity. No other restricted diffusion. No midline shift, mass effect, evidence of mass lesion, ventriculomegaly, extra-axial collection or acute intracranial hemorrhage. Cervicomedullary junction and pituitary are within normal limits. Outside of the acute finding gray and white matter signal appears within normal limits. Vascular: Major intracranial vascular flow voids are preserved. Skull and upper cervical spine: Negative visible cervical spine. Visualized bone marrow signal is within normal limits. Sinuses/Orbits: Negative orbits. Paranasal sinuses and mastoids are stable and well pneumatized. Other: Visible internal auditory structures appear normal. IMPRESSION: 1. Punctate acute lacunar infarct in the left globus pallidus. No associated hemorrhage or mass effect. 2. Otherwise negative noncontrast MRI appearance of the brain; the examination had to be discontinued prior to completion due to claustrophobia. Electronically Signed   By: Odessa FlemingH  Hall M.D.   On: 10/21/2019 00:44     Assessment/Plan Principal Problem:   Acute CVA (cerebrovascular accident) [facility](HCC) Active Problems:   Cocaine abuse (HCC)   Elevated LFTs    1. Acute CVA in the setting of using cocaine-appreciate neurology consult.  2D echo is pending.  Check hemoglobin A1c lipid panel.  Swallow evaluation neurochecks physical therapy consult. 2. Elevated LFTs likely from alcoholism.  Check acute hepatitis panel follow LFTs. 3. Polysubstance abuse including alcoholism cocaine and  tobacco abuse advised about quitting CIWA protocol.  Given that patient has weakness of the extremities with acute CVA will need more than 2 midnight stays in inpatient status.   DVT prophylaxis: Lovenox. Code Status: Full code. Family Communication: Discussed with patient. Disposition Plan: Home. Consults called: Neurology. Admission status: Inpatient.   Rise Patience MD [facility] Pager [phone].  If 7PM-7AM,  please contact night-coverage [email or web address] Password [facility]  10/21/2019, 5:17 AM

## 2019-10-21 NOTE — Progress Notes (Signed)
  Echocardiogram 2D Echocardiogram has been performed.  Jason Ryan Jason Ryan 10/21/2019, 2:13 PM

## 2019-10-21 NOTE — Consult Note (Signed)
Neurology Consultation Reason for Consult: Stroke Referring Physician: Pollina, C  CC: Right arm weakness  History is obtained from: Patient  HPI: Ripley Guinta is a 38 y.o. male with history of tobacco abuse who used cocaine last night. He awoke this morning with right arm weakness. He felt like it was likely because he slept on it and presented to the emergency department at [facility], however given that it did not fit a typical radial nerve distribution he was transferred to [facility] for MRI. He also had some left leg numbness which resolved this morning.   LKW: 11/30 prior to bed tpa given?: no, outside of window    ROS: A 14 point ROS was performed and is negative except as noted in the HPI.   History reviewed. No pertinent past medical history.   Family history: No history of similar  Social History:  reports that he has been smoking cigarettes. He has never used smokeless tobacco. He reports current alcohol use. He reports current drug use. Drug: Cocaine.   Exam: Current vital signs: BP 123/82   Pulse 78   Temp 98.6 F (37 C)   Resp 18   Ht 5\' 10"  (1.778 m)   Wt 61.2 kg   SpO2 99%   BMI 19.37 kg/m  Vital signs in last 24 hours: Temp:  [98.6 F (37 C)-98.7 F (37.1 C)] 98.6 F (37 C) (12/02 0100) Pulse Rate:  [70-99] 78 (12/02 0245) Resp:  [18] 18 (12/02 0100) BP: (105-147)/(60-96) 123/82 (12/02 0245) SpO2:  [95 %-100 %] 99 % (12/02 0245) Weight:  [61.2 kg] 61.2 kg (12/01 1740)   Physical Exam  Constitutional: Appears well-developed and well-nourished.  Psych: Affect appropriate to situation Eyes: No scleral injection HENT: No OP obstrucion MSK: no joint deformities.  Cardiovascular: Normal rate and regular rhythm.  Respiratory: Effort normal, non-labored breathing GI: Soft.  No distension. There is no tenderness.  Skin: WDI  Neuro: Mental Status: Patient is awake, alert, oriented to person, place, month, year, and situation. Patient is able  to give a clear and coherent history. No signs of aphasia or neglect Cranial Nerves: II: Visual Fields are full. Pupils are equal, round, and reactive to light.   III,IV, VI: EOMI without ptosis or diploplia.  V: Facial sensation is symmetric to temperature VII: Facial movement is symmetric.  VIII: hearing is intact to voice X: Uvula elevates symmetrically XI: Shoulder shrug is symmetric. XII: tongue is midline without atrophy or fasciculations.  Motor: Tone is normal. Bulk is normal. 5/5 strength was present in left arm and both legs. On the right, he has 4+/5 shoulder abduction 4/5 elbow flexion 4 -/5 elbow extension 3 to 4-/5 distally Sensory: Sensation is diminished to light touch in the right hand Cerebellar: No clear ataxia on the left    I have reviewed labs in epic and the results pertinent to this consultation are: Elevated AST and ALT  I have reviewed the images obtained: MRI brain-small basal ganglia infarct  Impression: 38 year old male with small basal ganglia infarct, almost certainly secondary to cocaine use.  He is going to need significant therapy and I would favor other risk stratification as well.  Recommendations: - HgbA1c, fasting lipid panel - Frequent neuro checks - Echocardiogram -CT angiogram of the head and neck - Prophylactic therapy-Antiplatelet med: Aspirin - dose 325mg  PO or 300mg  PR - Risk factor modification - Telemetry monitoring - PT consult, OT consult, Speech consult - Stroke team to follow  Roland Rack, MD  [facility] [phone]  If 7pm- 7am, please page neurology on call as listed in Man.

## 2019-10-21 NOTE — TOC Initial Note (Addendum)
Transition of Care [facility]) - Initial/Assessment Note    Patient Details  Name: Athel Merriweather MRN: [id number] Date of Birth: 01-Nov-1981  [facility] CM/SW Contact:    Pollie Friar, RN Phone Number: 10/21/2019, 1:13 PM  Clinical Narrative:                 Pt without a PCP. He is agreeable to a [facility]. Appt made and placed on the AVS. Pt can use [facility] pharmacy for medication assistance.  PT recommending outpatient therapy. Pt is agreeable. Awaiting OT recommendations.  TOC following.  Addendum: CM provided the patient with resources for online, outpatient and inpatient drug and alcohol counseling. Pt accepted information.  Expected Discharge Plan: OP Rehab Barriers to Discharge: Inadequate or no insurance, Barriers Unresolved (comment)   Patient Goals and CMS Choice     Choice offered to / list presented to : Patient  Expected Discharge Plan and Services Expected Discharge Plan: OP Rehab   Discharge Planning Services: CM Consult Post Acute Care Choice: Durable Medical Equipment Living arrangements for the past 2 months: Single Family Home                                      Prior Living Arrangements/Services Living arrangements for the past 2 months: Single Family Home Lives with:: Parents Patient language and need for interpreter reviewed:: Yes(no needs) Do you feel safe going back to the place where you live?: Yes        Care giver support system in place?: Yes (comment)(parents)   Criminal Activity/Legal Involvement Pertinent to Current Situation/Hospitalization: No - Comment as needed  Activities of Daily Living      Permission Sought/Granted                  Emotional Assessment Appearance:: Appears stated age Attitude/Demeanor/Rapport: Engaged Affect (typically observed): Accepting, Pleasant Orientation: : Oriented to Self, Oriented to Place, Oriented to  Time, Oriented to Situation   Psych Involvement: No  (comment)  Admission diagnosis:  Right arm numbness [R20.0] Right arm weakness [R29.898] Radial nerve palsy, right [G56.31] Numbness of left foot [R20.8] Acute CVA (cerebrovascular accident) [facility]) [I63.9] Patient Active Problem List   Diagnosis Date Noted  . Acute CVA (cerebrovascular accident) ([place]) 10/21/2019  . Cocaine abuse ([place]) 10/21/2019  . Elevated LFTs 10/21/2019   PCP:  Default, Provider, MD Pharmacy:   [facility] [place] [place] [address] Phone: [phone] Fax: [phone]  [place] [place] [address] Phone: [phone] Fax: [phone]     Social Determinants of Health (SDOH) Interventions    Readmission Risk Interventions No flowsheet data found.

## 2019-10-21 NOTE — Progress Notes (Signed)
STROKE TEAM PROGRESS NOTE   INTERVAL HISTORY Wife at bedside. Pt admitted that he is using cocaine and continues to smoke. Cocaine and smoking cessation education provided. Still has mild right facial droop and right arm weakness.   Vitals:   10/21/19 0749 10/21/19 0800 10/21/19 0828 10/21/19 1137  BP: 107/77 109/71 106/76 110/77  Pulse: 87 79 80 84  Resp: 19 16 18 16   Temp:   98.4 F (36.9 C) 98.2 F (36.8 C)  TempSrc:   Oral Oral  SpO2: 99% 98% 100% 92%  Weight:      Height:        CBC:  Recent Labs  Lab 10/20/19 1852 10/21/19 0752  WBC 6.9 3.9*  NEUTROABS 5.5  --   HGB 14.5 14.0  HCT 43.7 42.3  MCV 98.0 100.2*  PLT 231 41*    Basic Metabolic Panel:  Recent Labs  Lab 10/20/19 1852 10/21/19 0752  NA 138 140  K 3.7 3.8  CL 104 108  CO2 23 22  GLUCOSE 100* 75  BUN 10 7  CREATININE 0.93 0.92  CALCIUM 8.8* 7.8*  MG  --  1.7  PHOS  --  3.2   Lipid Panel:     Component Value Date/Time   CHOL 149 10/21/2019 0516   TRIG 40 10/21/2019 0516   HDL 88 10/21/2019 0516   CHOLHDL 1.7 10/21/2019 0516   VLDL 8 10/21/2019 0516   LDLCALC 53 10/21/2019 0516   HgbA1c:  Lab Results  Component Value Date   HGBA1C 4.8 10/21/2019   Urine Drug Screen:     Component Value Date/Time   LABOPIA NONE DETECTED 10/20/2019 1839   COCAINSCRNUR POSITIVE (A) 10/20/2019 1839   LABBENZ NONE DETECTED 10/20/2019 1839   AMPHETMU NONE DETECTED 10/20/2019 1839   THCU NONE DETECTED 10/20/2019 1839   LABBARB NONE DETECTED 10/20/2019 1839    Alcohol Level     Component Value Date/Time   ETH 41 (H) 10/20/2019 1852    IMAGING Ct Angio Head W Or Wo Contrast  Result Date: 10/21/2019 CLINICAL DATA:  Acute nonhemorrhagic infarct of the left globus pallidus. New onset right arm weakness. Cocaine and alcohol use. EXAM: CT ANGIOGRAPHY HEAD AND NECK TECHNIQUE: Multidetector CT imaging of the head and neck was performed using the standard protocol during bolus administration of intravenous  contrast. Multiplanar CT image reconstructions and MIPs were obtained to evaluate the vascular anatomy. Carotid stenosis measurements (when applicable) are obtained utilizing NASCET criteria, using the distal internal carotid diameter as the denominator. CONTRAST:  18mL OMNIPAQUE IOHEXOL 350 MG/ML SOLN COMPARISON:  None. MR head without contrast 10/21/2019 FINDINGS: CT HEAD FINDINGS Brain: Acute nonhemorrhagic infarct involving the left globus pallidus and genu of the internal capsule is again noted. No other acute infarct, hemorrhage, or mass lesion is present. White matter is otherwise within normal limits. No other focal cortical abnormalities are present. The ventricles are of normal size. No significant extraaxial fluid collection is present. The brainstem and cerebellum are within normal limits. Vascular: No hyperdense vessel or unexpected calcification. Skull: Normal. Negative for fracture or focal lesion. Sinuses: The paranasal sinuses and mastoid air cells are clear. Orbits: The globes and orbits are within normal limits. Review of the MIP images confirms the above findings CTA NECK FINDINGS Aortic arch: A 3 vessel arch configuration is present. No significant atherosclerotic disease or stenosis is present. Right carotid system: The right common carotid artery is within normal limits. Bifurcation is unremarkable. Cervical right ICA is normal. Left  carotid system: The left common carotid artery is within normal limits. The bifurcation is unremarkable. Cervical left ICA is normal. Vertebral arteries: The left vertebral artery is dominant. Both vertebral arteries originate from the subclavian arteries. There is no significant stenosis of either vertebral artery in the neck. Skeleton: Patient is scratched at the patient's head is rotated to the left. No focal osseous lesions are present. Alignment is anatomic. Other neck: Soft tissues of the neck are otherwise within normal limits. Upper chest: The lung apices  are clear. Review of the MIP images confirms the above findings CTA HEAD FINDINGS Anterior circulation: Internal carotid arteries are within normal limits from the skull base through the ICA termini bilaterally. The A1 and M1 segments are normal. The anterior communicating artery is patent. MCA bifurcations are intact. The ACA and MCA branch vessels are within normal limits. Posterior circulation: The left vertebral artery is the dominant vessel. PICA origins are visualized and normal. Vertebrobasilar junction is normal. The basilar artery is normal. Both posterior cerebral arteries originate from the basilar tip. PCA branch vessels are within normal limits. Venous sinuses: Dural sinuses are patent. The straight sinus deep cerebral veins are intact. Cortical veins are unremarkable. Anatomic variants: None Review of the MIP images confirms the above findings IMPRESSION: 1. Stable appearance of acute nonhemorrhagic infarct involving the left globus pallidus and genu of the internal capsule. 2. Normal CTA Circle of Willis without significant proximal stenosis, aneurysm, or branch vessel occlusion. 3. Normal CTA of the neck. Electronically Signed   By: Marin Roberts M.D.   On: 10/21/2019 04:50   Dg Elbow Complete Right  Result Date: 10/20/2019 CLINICAL DATA:  Right elbow pain.  Right arm numbness. EXAM: RIGHT ELBOW - COMPLETE 3+ VIEW COMPARISON:  None. FINDINGS: There is no evidence of fracture, dislocation, or joint effusion. There is no evidence of arthropathy or other focal bone abnormality. Soft tissues are unremarkable. IMPRESSION: Negative radiographs of the right elbow. Electronically Signed   By: Narda Rutherford M.D.   On: 10/20/2019 19:55   Ct Head Wo Contrast  Result Date: 10/20/2019 CLINICAL DATA:  38 year old male with right arm weakness. EXAM: CT HEAD WITHOUT CONTRAST TECHNIQUE: Contiguous axial images were obtained from the base of the skull through the vertex without intravenous contrast.  COMPARISON:  None. FINDINGS: Brain: The ventricles and sulci appropriate size for patient's age. The gray-white matter discrimination is preserved. There is no acute intracranial hemorrhage. No mass effect or midline shift. No extra-axial fluid collection. Vascular: No hyperdense vessel or unexpected calcification. Skull: Normal. Negative for fracture or focal lesion. Sinuses/Orbits: No acute finding. Other: None IMPRESSION: Unremarkable noncontrast CT of the brain. Electronically Signed   By: Elgie Collard M.D.   On: 10/20/2019 19:42   Ct Angio Neck W Or Wo Contrast  Result Date: 10/21/2019 CLINICAL DATA:  Acute nonhemorrhagic infarct of the left globus pallidus. New onset right arm weakness. Cocaine and alcohol use. EXAM: CT ANGIOGRAPHY HEAD AND NECK TECHNIQUE: Multidetector CT imaging of the head and neck was performed using the standard protocol during bolus administration of intravenous contrast. Multiplanar CT image reconstructions and MIPs were obtained to evaluate the vascular anatomy. Carotid stenosis measurements (when applicable) are obtained utilizing NASCET criteria, using the distal internal carotid diameter as the denominator. CONTRAST:  75mL OMNIPAQUE IOHEXOL 350 MG/ML SOLN COMPARISON:  None. MR head without contrast 10/21/2019 FINDINGS: CT HEAD FINDINGS Brain: Acute nonhemorrhagic infarct involving the left globus pallidus and genu of the internal capsule is again noted.  No other acute infarct, hemorrhage, or mass lesion is present. White matter is otherwise within normal limits. No other focal cortical abnormalities are present. The ventricles are of normal size. No significant extraaxial fluid collection is present. The brainstem and cerebellum are within normal limits. Vascular: No hyperdense vessel or unexpected calcification. Skull: Normal. Negative for fracture or focal lesion. Sinuses: The paranasal sinuses and mastoid air cells are clear. Orbits: The globes and orbits are within  normal limits. Review of the MIP images confirms the above findings CTA NECK FINDINGS Aortic arch: A 3 vessel arch configuration is present. No significant atherosclerotic disease or stenosis is present. Right carotid system: The right common carotid artery is within normal limits. Bifurcation is unremarkable. Cervical right ICA is normal. Left carotid system: The left common carotid artery is within normal limits. The bifurcation is unremarkable. Cervical left ICA is normal. Vertebral arteries: The left vertebral artery is dominant. Both vertebral arteries originate from the subclavian arteries. There is no significant stenosis of either vertebral artery in the neck. Skeleton: Patient is scratched at the patient's head is rotated to the left. No focal osseous lesions are present. Alignment is anatomic. Other neck: Soft tissues of the neck are otherwise within normal limits. Upper chest: The lung apices are clear. Review of the MIP images confirms the above findings CTA HEAD FINDINGS Anterior circulation: Internal carotid arteries are within normal limits from the skull base through the ICA termini bilaterally. The A1 and M1 segments are normal. The anterior communicating artery is patent. MCA bifurcations are intact. The ACA and MCA branch vessels are within normal limits. Posterior circulation: The left vertebral artery is the dominant vessel. PICA origins are visualized and normal. Vertebrobasilar junction is normal. The basilar artery is normal. Both posterior cerebral arteries originate from the basilar tip. PCA branch vessels are within normal limits. Venous sinuses: Dural sinuses are patent. The straight sinus deep cerebral veins are intact. Cortical veins are unremarkable. Anatomic variants: None Review of the MIP images confirms the above findings IMPRESSION: 1. Stable appearance of acute nonhemorrhagic infarct involving the left globus pallidus and genu of the internal capsule. 2. Normal CTA Circle of  Willis without significant proximal stenosis, aneurysm, or branch vessel occlusion. 3. Normal CTA of the neck. Electronically Signed   By: Marin Roberts M.D.   On: 10/21/2019 04:50   Mr Brain Wo Contrast (neuro Protocol)  Result Date: 10/21/2019 CLINICAL DATA:  38 year old male with right arm weakness. Cocaine and alcohol use. EXAM: MRI HEAD WITHOUT CONTRAST TECHNIQUE: Multiplanar, multiecho pulse sequences of the brain and surrounding structures were obtained without intravenous contrast. COMPARISON:  Head CT earlier tonight. FINDINGS: The examination had to be discontinued prior to completion due to claustrophobia. Axial and coronal DWI as well as sagittal T1 and axial T2 imaging was obtained. Brain: Punctate restricted diffusion in the left globus pallidus on series 5, image 71 and series 7, image 53. Minimal associated T2 hyperintensity. No other restricted diffusion. No midline shift, mass effect, evidence of mass lesion, ventriculomegaly, extra-axial collection or acute intracranial hemorrhage. Cervicomedullary junction and pituitary are within normal limits. Outside of the acute finding gray and white matter signal appears within normal limits. Vascular: Major intracranial vascular flow voids are preserved. Skull and upper cervical spine: Negative visible cervical spine. Visualized bone marrow signal is within normal limits. Sinuses/Orbits: Negative orbits. Paranasal sinuses and mastoids are stable and well pneumatized. Other: Visible internal auditory structures appear normal. IMPRESSION: 1. Punctate acute lacunar infarct in the left  globus pallidus. No associated hemorrhage or mass effect. 2. Otherwise negative noncontrast MRI appearance of the brain; the examination had to be discontinued prior to completion due to claustrophobia. Electronically Signed   By: Genevie Ann M.D.   On: 10/21/2019 00:44    PHYSICAL EXAM  Temp:  [98.2 F (36.8 C)-98.7 F (37.1 C)] 98.2 F (36.8 C) (12/02  1137) Pulse Rate:  [70-99] 84 (12/02 1137) Resp:  [16-22] 16 (12/02 1137) BP: (105-147)/(60-96) 110/77 (12/02 1137) SpO2:  [92 %-100 %] 92 % (12/02 1137) Weight:  [61.2 kg] 61.2 kg (12/01 1740)  General - Well nourished, well developed, in no apparent distress.  Ophthalmologic - fundi not visualized due to noncooperation.  Cardiovascular - Regular rhythm and rate.  Mental Status -  Level of arousal and orientation to time, place, and person were intact. Language including expression, naming, repetition, comprehension was assessed and found intact. Fund of Knowledge was assessed and was intact.  Cranial Nerves II - XII - II - Visual field intact OU. III, IV, VI - Extraocular movements intact. V - Facial sensation intact bilaterally. VII - mild right facial droop. VIII - Hearing & vestibular intact bilaterally. X - Palate elevates symmetrically. Slight dysarthria XI - Chin turning & shoulder shrug intact bilaterally. XII - Tongue protrusion intact.  Motor Strength - The patient's strength was normal in all extremities except RUE proximal 4-/5 and distal 4/5 and pronator drift was present.  Bulk was normal and fasciculations were absent.   Motor Tone - Muscle tone was assessed at the neck and appendages and was normal.  Reflexes - The patient's reflexes were symmetrical in all extremities and he had no pathological reflexes.  Sensory - Light touch, temperature/pinprick were assessed and were decreased on the RUE, about 50% comparing with left.    Coordination - The patient had normal movements in the hands with no ataxia or dysmetria.  Tremor was absent.  Gait and Station - deferred.   ASSESSMENT/PLAN Mr. Jason Ryan is a 38 y.o. male with history of tobacco abuse who used cocaine night prior to admission who woke with R arm weakness and L leg numbness, both resolved.   Stroke:  Punctate L globus pallidus lacunar infarct embolic if the setting of cocaine use and  smoker  CT head Unremarkable   MRI  Punctate L globus pallidus lacune  CTA head & neck unremarkable  2D Echo pending  LDL 53  HgbA1c 4.8  HIV negative    RPR negative    UDS positive for cocaine   SCDs for VTE prophylaxis  No antithrombotic prior to admission, now on aspirin 81 mg daily and clopidogrel 75 mg daily. Continue DAPT for 3 weeks and then ASA alone.   Therapy recommendations:  OP PT  Disposition:  Return home  Cocaine abuse  Admitted cocaine use  UDS showed positive cocaine  Cessation education provided  Pt is willing to quit  Tobacco abuse  Current smoker  Smoking cessation counseling provided  Pt is willing to quit  Hypertension  Stable . BP goal normotensive  Alcohol abuse   alcohol level 41  On MVI/B1/FA  On CIWA protocol  advised to drink no more than 2 drink(s) a day  Other Stroke Risk Factors    Other Active Problems  Mild rhabdo/elevated CK in setting of cocaine abuse  Abnormal LFTs/Elevated LFT from alcohol use   Hospital day # 0  Neurology will sign off. Please call with questions. Pt [facility]. Thanks for the consult.  Marvel PlanJindong Philisha Weinel, MD PhD Stroke Neurology 10/21/2019 2:10 PM   To contact Stroke Continuity provider, please refer to [email or web address]. After hours, contact General Neurology

## 2019-10-22 LAB — COMPREHENSIVE METABOLIC PANEL
ALT: 68 U/L — ABNORMAL HIGH (ref 0–44)
AST: 98 U/L — ABNORMAL HIGH (ref 15–41)
Albumin: 3.3 g/dL — ABNORMAL LOW (ref 3.5–5.0)
Alkaline Phosphatase: 50 U/L (ref 38–126)
Anion gap: 8 (ref 5–15)
BUN: 6 mg/dL (ref 6–20)
CO2: 28 mmol/L (ref 22–32)
Calcium: 8.3 mg/dL — ABNORMAL LOW (ref 8.9–10.3)
Chloride: 105 mmol/L (ref 98–111)
Creatinine, Ser: 0.92 mg/dL (ref 0.61–1.24)
GFR calc Af Amer: >60 mL/min (ref >60)
GFR calc non Af Amer: >60 mL/min (ref >60)
Glucose, Bld: 100 mg/dL — ABNORMAL HIGH (ref 70–99)
Potassium: 3.8 mmol/L (ref 3.5–5.1)
Sodium: 141 mmol/L (ref 135–145)
Total Bilirubin: 0.9 mg/dL (ref 0.3–1.2)
Total Protein: 5.4 g/dL — ABNORMAL LOW (ref 6.5–8.1)

## 2019-10-22 LAB — CBC WITH DIFFERENTIAL/PLATELET
Abs Immature Granulocytes: 0.01 10*3/uL (ref 0.00–0.07)
Basophils Absolute: 0 10*3/uL (ref 0.0–0.1)
Basophils Relative: 0 %
Eosinophils Absolute: 0.2 10*3/uL (ref 0.0–0.5)
Eosinophils Relative: 4 %
HCT: 40.2 % (ref 39.0–52.0)
Hemoglobin: 13.7 g/dL (ref 13.0–17.0)
Immature Granulocytes: 0 %
Lymphocytes Relative: 33 %
Lymphs Abs: 1.7 10*3/uL (ref 0.7–4.0)
MCH: 33.2 pg (ref 26.0–34.0)
MCHC: 34.1 g/dL (ref 30.0–36.0)
MCV: 97.3 fL (ref 80.0–100.0)
Monocytes Absolute: 0.5 10*3/uL (ref 0.1–1.0)
Monocytes Relative: 9 %
Neutro Abs: 2.7 10*3/uL (ref 1.7–7.7)
Neutrophils Relative %: 54 %
Platelets: 229 10*3/uL (ref 150–400)
RBC: 4.13 MIL/uL — ABNORMAL LOW (ref 4.22–5.81)
RDW: 12.3 % (ref 11.5–15.5)
WBC: 5 10*3/uL (ref 4.0–10.5)
nRBC: 0 % (ref 0.0–0.2)

## 2019-10-22 LAB — CK
Total CK: 3995 U/L — ABNORMAL HIGH (ref 49–397)
Total CK: 3256 U/L — ABNORMAL HIGH (ref 49–397)

## 2019-10-22 LAB — PHOSPHORUS: Phosphorus: 3.1 mg/dL (ref 2.5–4.6)

## 2019-10-22 LAB — MAGNESIUM: Magnesium: 1.9 mg/dL (ref 1.7–2.4)

## 2019-10-22 MED ORDER — SODIUM CHLORIDE 0.9 % IV BOLUS
500.0000 mL | Freq: Once | INTRAVENOUS | Status: AC
  Administered 2019-10-22: 500 mL via INTRAVENOUS

## 2019-10-22 MED ORDER — FOLIC ACID 1 MG PO TABS: 1.0000 mg | ORAL_TABLET | Freq: Every day | ORAL | 0 refills | Status: DC

## 2019-10-22 MED ORDER — SODIUM CHLORIDE 0.9 % IV SOLN
INTRAVENOUS | Status: DC
  Administered 2019-10-22: 08:00:00 via INTRAVENOUS

## 2019-10-22 MED ORDER — ASPIRIN 81 MG PO TBEC: 81.0000 mg | DELAYED_RELEASE_TABLET | Freq: Every day | ORAL | 11 refills | Status: AC

## 2019-10-22 MED ORDER — THIAMINE HCL 100 MG PO TABS: 100.0000 mg | ORAL_TABLET | Freq: Every day | ORAL | 0 refills | Status: DC

## 2019-10-22 MED ORDER — CLOPIDOGREL BISULFATE 75 MG PO TABS: 75.0000 mg | ORAL_TABLET | Freq: Every day | ORAL | 0 refills | Status: DC

## 2019-10-22 MED ORDER — THIAMINE HCL 100 MG PO TABS: 100.0000 mg | ORAL_TABLET | Freq: Every day | ORAL | 0 refills | Status: AC

## 2019-10-22 MED ORDER — CLOPIDOGREL BISULFATE 75 MG PO TABS: 75.0000 mg | ORAL_TABLET | Freq: Every day | ORAL | 0 refills | Status: AC

## 2019-10-22 MED ORDER — FOLIC ACID 1 MG PO TABS: 1.0000 mg | ORAL_TABLET | Freq: Every day | ORAL | 0 refills | Status: AC

## 2019-10-22 MED ORDER — ASPIRIN 81 MG PO TBEC: 81.0000 mg | DELAYED_RELEASE_TABLET | Freq: Every day | ORAL | 11 refills | Status: DC

## 2019-10-22 MED ORDER — ADULT MULTIVITAMIN W/MINERALS CH: 1.0000 | ORAL_TABLET | Freq: Every day | ORAL | 0 refills | Status: AC

## 2019-10-22 MED ORDER — ADULT MULTIVITAMIN W/MINERALS CH: 1.0000 | ORAL_TABLET | Freq: Every day | ORAL | 0 refills | Status: DC

## 2019-10-22 MED FILL — ASPIRIN LOW DOSE 81 MG TBEC: 81 | 30 days supply | Qty: 30 | Fill #0

## 2019-10-22 MED FILL — FOLIC ACID 1 MG TABS: 1 | 30 days supply | Qty: 30 | Fill #0

## 2019-10-22 MED FILL — VITAMIN B-1 100 MG TABS: 100 | 30 days supply | Qty: 30 | Fill #0

## 2019-10-22 MED FILL — CLOPIDOGREL 75 MG TABLET: 75 | 21 days supply | Qty: 21 | Fill #0

## 2019-10-22 NOTE — Progress Notes (Signed)
SLP Cancellation Note  Patient Details Name: Ada Woodbury MRN: [id number] DOB: [birth year]   Cancelled treatment:       Reason Eval/Treat Not Completed: SLP screened, no needs identified, will sign off   Lisbet Busker 10/22/2019, 9:04 AM

## 2019-10-22 NOTE — Discharge Summary (Signed)
Physician Discharge Summary  Jason Ryan EAV:[id number] DOB: [birth year] DOA: 10/20/2019  PCP: Default, Provider, MD  Admit date: 10/20/2019 Discharge date: 10/22/2019  Admitted From: Home Disposition: Home with Outpatient PT  Recommendations for Outpatient Follow-up:  1. Follow up with PCP in 1-2 weeks 2. Follow up with Neurology within 4 weeks  3. Please obtain CMP/CBC, Mag, Phos in one week 4. Please follow up on the following pending results:  Home Health: No  Equipment/Devices: None    Discharge Condition: Stable  CODE STATUS: FULL CODE  Diet recommendation: Heart Healthy Diet   Brief/Interim Summary: Patient is a 38 year old African-American male with a past medical history significant for but not limited to polysubstance abuse including cocaine, alcohol tobacco abuse as well as other comorbid conditions who presented to the emergency room with chief complaint of right extremity weakness and numbness.  Patient found it was difficult to move his right upper extremity since yesterday morning.  The night before he had some cocaine alcohol and he woke up with weakness which persisted prompting him to come to the emergency room for further evaluation.  Denies any weakness of any other extremities or difficulty speaking or swallowing.  In the ED he was found to have a strength of 4-5 out of extremities transferred to [facility] for ruling out a stroke.  An MRI of the brain was done showed no acute CVA involving the left globus pallidus.  He was admitted for further management and evaluation and neurology was consulted for assistance with management.  He underwent a CT angiogram of the head and neck and a UDS was positive for acute cocaine.  LFTs were elevated in the setting of alcoholism and recent alcohol abuse but have started trending down.  He felt his arm was improving however he still weak and had some numbness.  He underwent a full stroke work-up and was placed on aspirin and Plavix for  3 weeks and then will go home on aspirin alone after that monotherapy.  He was deemed stable after therapy is recommended outpatient therapy.  He will need to follow-up with PCP within 1 to 2 weeks and follow-up with neurology in 4 weeks as he has been stable for discharge.  Discharge Diagnoses:  Principal Problem:   Acute CVA (cerebrovascular accident) [facility]) Active Problems:   Cocaine abuse (HCC)   Elevated LFTs  Acute CVA in the setting of using cocaine -Confirmed on MRI as above -CTA of the head and neck done and showed "Stable appearance of acute nonhemorrhagic infarct involving the left globus pallidus and genu of the internal capsule. Normal CTA Circle of Willis without significant proximal stenosis, aneurysm, or branch vessel occlusion. Normal CTA of the neck." -Appreciate neurology consult.  -2D echo done and showed "Left ventricular ejection fraction, by visual estimation, is 55 to 60%. The left ventricle has normal function. There is no left ventricular hypertrophy. Left ventricular diastolic parameters were normal. -Check hemoglobin A1c and lipid panel. -Lipid panel done and showed a cholesterol level of 149, HDL of 88, LDL 53, triglycerides of 40, VLDL of 8 -Hemoglobin A1c was 4.8 -Bedside Swallow evaluation  done and patient passed -Neurochecks per protocol -Therapy Consults with Physical Therapy, Occupational Therapy and SLP; recommending outpatient PT -Patient was started on aspirin 81 mg and clopidogrel 75 mg for dual antiplatelet therapy for 3 weeks and then will go on aspirin monotherapy alone -Continued with stratification modification and recommended stopping smoking and illicit substance use -Continued telemetry -Further care per stroke team  and further evaluation -Allow for permissive hypertension -Of note SARS-CoV-2 negative  Mild Rhabdomyolysis/Eelevated CK level in the setting of cocaine abuse -Patient presented with a CK level of 6254 and has now trended down  to 3256 after IV fluid hydration -Continue IV fluid hydration while hospitalized encourage p.o. hydration at discharge  Abnormal LFTs/Elevated LFTs likely from alcoholism. -Check acute hepatitis panel this was negative -Patient's AST went from 256 is now 98 and ALT went from 105 and is now 49 -Continue to monitor trend hepatic function panel in the outpatient setting -Repeat CMP within 1 week  Polysubstance abuse including Alcoholism, Cocaine, and Tobacco Abuse  -Counseling given and advised about quitting  -CIWA protocol.  Thrombocytopenia -Likely spurious result as it is recovered nicely -Patient's platelet count went from 231 -> 41 -> 229 -Repeat CBC now  Leukopenia -Patient's WBC of 6.9 is now 3.9 and improved to 5.0 -Repeat CBC within 1-2 weeks   Discharge Instructions  Discharge Instructions    Ambulatory referral to Neurology   Complete by: As directed    Follow up with stroke clinic NP (Jessica Vanschaick or Darrol Angel, if both not available, consider Manson Allan, or Ahern) at [facility] in about 4 weeks. Thanks.   Ambulatory referral to Occupational Therapy   Complete by: As directed    Ambulatory referral to Occupational Therapy   Complete by: As directed    Ambulatory referral to Physical Therapy   Complete by: As directed    Ambulatory referral to Physical Therapy   Complete by: As directed    Call MD for:  difficulty breathing, headache or visual disturbances   Complete by: As directed    Call MD for:  extreme fatigue   Complete by: As directed    Call MD for:  hives   Complete by: As directed    Call MD for:  persistant dizziness or light-headedness   Complete by: As directed    Call MD for:  persistant nausea and vomiting   Complete by: As directed    Call MD for:  redness, tenderness, or signs of infection (pain, swelling, redness, odor or green/yellow discharge around incision site)   Complete by: As directed    Call MD for:  severe uncontrolled  pain   Complete by: As directed    Call MD for:  temperature >100.4   Complete by: As directed    Diet - low sodium heart healthy   Complete by: As directed    Discharge instructions   Complete by: As directed    You were cared for by a hospitalist during your hospital stay. If you have any questions about your discharge medications or the care you received while you were in the hospital after you are discharged, you can call the unit and ask to speak with the hospitalist on call if the hospitalist that took care of you is not available. Once you are discharged, your primary care physician will handle any further medical issues. Please note that NO REFILLS for any discharge medications will be authorized once you are discharged, as it is imperative that you return to your primary care physician (or establish a relationship with a primary care physician if you do not have one) for your aftercare needs so that they can reassess your need for medications and monitor your lab values.  Follow up with PCP and Neurology, Take all medications as prescribed. Avoid Alcohol, Tobacco Abuse and Illicit Drug Use.  If symptoms change or worsen please return  to the ED for evaluation   Increase activity slowly   Complete by: As directed      Allergies as of 10/22/2019      Reactions   Pork-derived Products Other (See Comments)   Patient preference      Medication List    TAKE these medications   aspirin 81 MG EC tablet Take 1 tablet (81 mg total) by mouth daily. Start taking on: October 23, 2019   clopidogrel 75 MG tablet Commonly known as: PLAVIX Take 1 tablet (75 mg total) by mouth daily. Start taking on: October 23, 2019   folic acid 1 MG tablet Commonly known as: FOLVITE Take 1 tablet (1 mg total) by mouth daily. Start taking on: October 23, 2019   multivitamin with minerals Tabs tablet Take 1 tablet by mouth daily. Start taking on: October 23, 2019   thiamine 100 MG tablet Take 1 tablet  (100 mg total) by mouth daily. Start taking on: October 23, 2019      Follow-up Information    [facility]. Schedule an appointment as soon as possible for a visit in 4 week(s).   Contact information: [address]     [address] [place] [address] [phone]       [facility].   Contact information: [address] [place], [address] [place] [address] [phone]       [facility] Follow up on 12/07/2019.   Why: your appointment time is at 2:30. Please arrive early and bring picture ID and your current medications. Contact information: [address], [place], [place] [address]  [phone]       [facility] Follow up.   Why: Use this location for your pharmacy needs. They will assist with the cost of your medications. Contact information: [address] [place] [address] [phone]       [facility] Follow up.   Why: The outpatient therapy will call you for the first appointment Contact information: [phone]         Allergies  Allergen Reactions  . Pork-Derived Products Other (See Comments)    Patient preference   Consultations:  Neurology/Stroke Team  Procedures/Studies: Ct Angio Head W Or Wo Contrast  Result Date: 10/21/2019 CLINICAL DATA:  Acute nonhemorrhagic infarct of the left globus pallidus. New onset right arm weakness. Cocaine and alcohol use. EXAM: CT ANGIOGRAPHY HEAD AND NECK TECHNIQUE: Multidetector CT imaging of the head and neck was performed using the standard protocol during bolus administration of intravenous contrast. Multiplanar CT image reconstructions and MIPs were obtained to evaluate the vascular anatomy. Carotid stenosis measurements (when applicable) are obtained utilizing NASCET criteria, using the distal internal carotid diameter as the denominator. CONTRAST:  75mL OMNIPAQUE  IOHEXOL 350 MG/ML SOLN COMPARISON:  None. MR head without contrast 10/21/2019 FINDINGS: CT HEAD FINDINGS Brain: Acute nonhemorrhagic infarct involving the left globus pallidus and genu of the internal capsule is again noted. No other acute infarct, hemorrhage, or mass lesion is present. White matter is otherwise within normal limits. No other focal cortical abnormalities are present. The ventricles are of normal size. No significant extraaxial fluid collection is present. The brainstem and cerebellum are within normal limits. Vascular: No hyperdense vessel or unexpected calcification. Skull: Normal. Negative for fracture or focal lesion. Sinuses: The paranasal sinuses and mastoid air cells are clear. Orbits: The globes and orbits are within normal limits. Review of the MIP images confirms the  above findings CTA NECK FINDINGS Aortic arch: A 3 vessel arch configuration is present. No significant atherosclerotic disease or stenosis is present. Right carotid system: The right common carotid artery is within normal limits. Bifurcation is unremarkable. Cervical right ICA is normal. Left carotid system: The left common carotid artery is within normal limits. The bifurcation is unremarkable. Cervical left ICA is normal. Vertebral arteries: The left vertebral artery is dominant. Both vertebral arteries originate from the subclavian arteries. There is no significant stenosis of either vertebral artery in the neck. Skeleton: Patient is scratched at the patient's head is rotated to the left. No focal osseous lesions are present. Alignment is anatomic. Other neck: Soft tissues of the neck are otherwise within normal limits. Upper chest: The lung apices are clear. Review of the MIP images confirms the above findings CTA HEAD FINDINGS Anterior circulation: Internal carotid arteries are within normal limits from the skull base through the ICA termini bilaterally. The A1 and M1 segments are normal. The anterior communicating artery  is patent. MCA bifurcations are intact. The ACA and MCA branch vessels are within normal limits. Posterior circulation: The left vertebral artery is the dominant vessel. PICA origins are visualized and normal. Vertebrobasilar junction is normal. The basilar artery is normal. Both posterior cerebral arteries originate from the basilar tip. PCA branch vessels are within normal limits. Venous sinuses: Dural sinuses are patent. The straight sinus deep cerebral veins are intact. Cortical veins are unremarkable. Anatomic variants: None Review of the MIP images confirms the above findings IMPRESSION: 1. Stable appearance of acute nonhemorrhagic infarct involving the left globus pallidus and genu of the internal capsule. 2. Normal CTA Circle of Willis without significant proximal stenosis, aneurysm, or branch vessel occlusion. 3. Normal CTA of the neck. Electronically Signed   By: Marin Roberts M.D.   On: 10/21/2019 04:50   Dg Elbow Complete Right  Result Date: 10/20/2019 CLINICAL DATA:  Right elbow pain.  Right arm numbness. EXAM: RIGHT ELBOW - COMPLETE 3+ VIEW COMPARISON:  None. FINDINGS: There is no evidence of fracture, dislocation, or joint effusion. There is no evidence of arthropathy or other focal bone abnormality. Soft tissues are unremarkable. IMPRESSION: Negative radiographs of the right elbow. Electronically Signed   By: Narda Rutherford M.D.   On: 10/20/2019 19:55   Ct Head Wo Contrast  Result Date: 10/20/2019 CLINICAL DATA:  38 year old male with right arm weakness. EXAM: CT HEAD WITHOUT CONTRAST TECHNIQUE: Contiguous axial images were obtained from the base of the skull through the vertex without intravenous contrast. COMPARISON:  None. FINDINGS: Brain: The ventricles and sulci appropriate size for patient's age. The gray-white matter discrimination is preserved. There is no acute intracranial hemorrhage. No mass effect or midline shift. No extra-axial fluid collection. Vascular: No hyperdense  vessel or unexpected calcification. Skull: Normal. Negative for fracture or focal lesion. Sinuses/Orbits: No acute finding. Other: None IMPRESSION: Unremarkable noncontrast CT of the brain. Electronically Signed   By: Elgie Collard M.D.   On: 10/20/2019 19:42   Ct Angio Neck W Or Wo Contrast  Result Date: 10/21/2019 CLINICAL DATA:  Acute nonhemorrhagic infarct of the left globus pallidus. New onset right arm weakness. Cocaine and alcohol use. EXAM: CT ANGIOGRAPHY HEAD AND NECK TECHNIQUE: Multidetector CT imaging of the head and neck was performed using the standard protocol during bolus administration of intravenous contrast. Multiplanar CT image reconstructions and MIPs were obtained to evaluate the vascular anatomy. Carotid stenosis measurements (when applicable) are obtained utilizing NASCET criteria, using the distal internal  carotid diameter as the denominator. CONTRAST:  75mL OMNIPAQUE IOHEXOL 350 MG/ML SOLN COMPARISON:  None. MR head without contrast 10/21/2019 FINDINGS: CT HEAD FINDINGS Brain: Acute nonhemorrhagic infarct involving the left globus pallidus and genu of the internal capsule is again noted. No other acute infarct, hemorrhage, or mass lesion is present. White matter is otherwise within normal limits. No other focal cortical abnormalities are present. The ventricles are of normal size. No significant extraaxial fluid collection is present. The brainstem and cerebellum are within normal limits. Vascular: No hyperdense vessel or unexpected calcification. Skull: Normal. Negative for fracture or focal lesion. Sinuses: The paranasal sinuses and mastoid air cells are clear. Orbits: The globes and orbits are within normal limits. Review of the MIP images confirms the above findings CTA NECK FINDINGS Aortic arch: A 3 vessel arch configuration is present. No significant atherosclerotic disease or stenosis is present. Right carotid system: The right common carotid artery is within normal limits.  Bifurcation is unremarkable. Cervical right ICA is normal. Left carotid system: The left common carotid artery is within normal limits. The bifurcation is unremarkable. Cervical left ICA is normal. Vertebral arteries: The left vertebral artery is dominant. Both vertebral arteries originate from the subclavian arteries. There is no significant stenosis of either vertebral artery in the neck. Skeleton: Patient is scratched at the patient's head is rotated to the left. No focal osseous lesions are present. Alignment is anatomic. Other neck: Soft tissues of the neck are otherwise within normal limits. Upper chest: The lung apices are clear. Review of the MIP images confirms the above findings CTA HEAD FINDINGS Anterior circulation: Internal carotid arteries are within normal limits from the skull base through the ICA termini bilaterally. The A1 and M1 segments are normal. The anterior communicating artery is patent. MCA bifurcations are intact. The ACA and MCA branch vessels are within normal limits. Posterior circulation: The left vertebral artery is the dominant vessel. PICA origins are visualized and normal. Vertebrobasilar junction is normal. The basilar artery is normal. Both posterior cerebral arteries originate from the basilar tip. PCA branch vessels are within normal limits. Venous sinuses: Dural sinuses are patent. The straight sinus deep cerebral veins are intact. Cortical veins are unremarkable. Anatomic variants: None Review of the MIP images confirms the above findings IMPRESSION: 1. Stable appearance of acute nonhemorrhagic infarct involving the left globus pallidus and genu of the internal capsule. 2. Normal CTA Circle of Willis without significant proximal stenosis, aneurysm, or branch vessel occlusion. 3. Normal CTA of the neck. Electronically Signed   By: Marin Roberts M.D.   On: 10/21/2019 04:50   Mr Brain Wo Contrast (neuro Protocol)  Result Date: 10/21/2019 CLINICAL DATA:  38 year old  male with right arm weakness. Cocaine and alcohol use. EXAM: MRI HEAD WITHOUT CONTRAST TECHNIQUE: Multiplanar, multiecho pulse sequences of the brain and surrounding structures were obtained without intravenous contrast. COMPARISON:  Head CT earlier tonight. FINDINGS: The examination had to be discontinued prior to completion due to claustrophobia. Axial and coronal DWI as well as sagittal T1 and axial T2 imaging was obtained. Brain: Punctate restricted diffusion in the left globus pallidus on series 5, image 71 and series 7, image 53. Minimal associated T2 hyperintensity. No other restricted diffusion. No midline shift, mass effect, evidence of mass lesion, ventriculomegaly, extra-axial collection or acute intracranial hemorrhage. Cervicomedullary junction and pituitary are within normal limits. Outside of the acute finding gray and white matter signal appears within normal limits. Vascular: Major intracranial vascular flow voids are preserved. Skull and  upper cervical spine: Negative visible cervical spine. Visualized bone marrow signal is within normal limits. Sinuses/Orbits: Negative orbits. Paranasal sinuses and mastoids are stable and well pneumatized. Other: Visible internal auditory structures appear normal. IMPRESSION: 1. Punctate acute lacunar infarct in the left globus pallidus. No associated hemorrhage or mass effect. 2. Otherwise negative noncontrast MRI appearance of the brain; the examination had to be discontinued prior to completion due to claustrophobia. Electronically Signed   By: Odessa Fleming M.D.   On: 10/21/2019 00:44   ECHOCARDIOGRAM 10/21/2019 IMPRESSIONS    1. Left ventricular ejection fraction, by visual estimation, is 55 to 60%. The left ventricle has normal function. There is no left ventricular hypertrophy.  2. Global right ventricle has normal systolic function.The right ventricular size is mildly enlarged. No increase in right ventricular wall thickness.  3. Left atrial size was  normal.  4. Right atrial size was normal.  5. The mitral valve is normal in structure. No evidence of mitral valve regurgitation.  6. The tricuspid valve is normal in structure. Tricuspid valve regurgitation is not demonstrated.  7. The aortic valve is tricuspid. Aortic valve regurgitation is not visualized. No evidence of aortic valve sclerosis or stenosis.  8. The pulmonic valve was not well visualized. Pulmonic valve regurgitation is trivial.  9. TR signal is inadequate for assessing pulmonary artery systolic pressure. 10. The inferior vena cava is normal in size with greater than 50% respiratory variability, suggesting right atrial pressure of 3 mmHg. 11. The interatrial septum was not well visualized.  FINDINGS  Left Ventricle: Left ventricular ejection fraction, by visual estimation, is 55 to 60%. The left ventricle has normal function. There is no left ventricular hypertrophy. Left ventricular diastolic parameters were normal.  Right Ventricle: The right ventricular size is mildly enlarged. No increase in right ventricular wall thickness. Global RV systolic function is has normal systolic function.  Left Atrium: Left atrial size was normal in size.  Right Atrium: Right atrial size was normal in size  Pericardium: There is no evidence of pericardial effusion.  Mitral Valve: The mitral valve is normal in structure. No evidence of mitral valve regurgitation.  Tricuspid Valve: The tricuspid valve is normal in structure. Tricuspid valve regurgitation is not demonstrated.  Aortic Valve: The aortic valve is tricuspid. Aortic valve regurgitation is not visualized. The aortic valve is structurally normal, with no evidence of sclerosis or stenosis.  Pulmonic Valve: The pulmonic valve was not well visualized. Pulmonic valve regurgitation is trivial.  Aorta: The aortic root and ascending aorta are structurally normal, with no evidence of dilitation.  Venous: The inferior vena  cava is normal in size with greater than 50% respiratory variability, suggesting right atrial pressure of 3 mmHg.  IAS/Shunts: The interatrial septum was not well visualized.    LEFT VENTRICLE PLAX 2D LVIDd:         4.28 cm  Diastology LVIDs:         3.27 cm  LV e' lateral:   15.20 cm/s LV PW:         0.83 cm  LV E/e' lateral: 4.0 LV IVS:        0.83 cm  LV e' medial:    8.70 cm/s LVOT diam:     1.90 cm  LV E/e' medial:  6.9 LV SV:         39 ml LV SV Index:   22.55 LVOT Area:     2.84 cm    RIGHT VENTRICLE  IVC RV Basal diam:  3.13 cm     IVC diam: 1.76 cm RV Mid diam:    1.80 cm RV S prime:     11.50 cm/s TAPSE (M-mode): 2.0 cm  LEFT ATRIUM             Index       RIGHT ATRIUM           Index LA diam:        2.60 cm 1.47 cm/m  RA Area:     15.60 cm LA Vol (A2C):   54.2 ml 30.69 ml/m RA Volume:   40.70 ml  23.04 ml/m LA Vol (A4C):   19.2 ml 10.87 ml/m LA Biplane Vol: 32.2 ml 18.23 ml/m  AORTIC VALVE LVOT Vmax:   92.40 cm/s LVOT Vmean:  62.400 cm/s LVOT VTI:    0.169 m   AORTA Ao Root diam: 3.10 cm Ao Asc diam:  2.70 cm  MITRAL VALVE MV Area (PHT): 2.83 cm             SHUNTS MV PHT:        77.72 msec           Systemic VTI:  0.17 m MV Decel Time: 268 msec             Systemic Diam: 1.90 cm MV E velocity: 60.10 cm/s 103 cm/s MV A velocity: 44.10 cm/s 70.3 cm/s MV E/A ratio:  1.36       1.5  Subjective: Seen and examined at bedside and he was using the stress ball of the work with his right hand.  No nausea or vomiting.  Denies any lightheadedness or dizziness and felt well.  No other concerns or complaints this time and is deemed stable for discharge home and will need to follow-up with PCP and neurology in outpatient setting he understands and is agreeable with the plan of care  Discharge Exam: Vitals:   10/22/19 0939 10/22/19 1156  BP: 120/87 124/90  Pulse: 71 74  Resp: 18 16  Temp: 98.5 F (36.9 C) 98.5 F (36.9 C)  SpO2: 100% 99%    Vitals:   10/22/19 0006 10/22/19 0409 10/22/19 0939 10/22/19 1156  BP: 118/81 118/84 120/87 124/90  Pulse: 70 67 71 74  Resp: 16 15 18 16   Temp: 98.9 F (37.2 C) 98.1 F (36.7 C) 98.5 F (36.9 C) 98.5 F (36.9 C)  TempSrc: Oral Oral Oral Oral  SpO2: 98% 100% 100% 99%  Weight:      Height:       General: Pt is alert, awake, not in acute distress Cardiovascular: RRR, S1/S2 +, no rubs, no gallops Respiratory: Mildly diminished bilaterally, no wheezing, no rhonchi Abdominal: Soft, NT, ND, bowel sounds + Extremities: no edema, no cyanosis; right arm is weaker than left and has diminished sensation  The results of significant diagnostics from this hospitalization (including imaging, microbiology, ancillary and laboratory) are listed below for reference.    Microbiology: Recent Results (from the past 240 hour(s))  SARS CORONAVIRUS 2 (TAT 6-24 HRS) Nasopharyngeal Nasopharyngeal Swab     Status: None   Collection Time: 10/21/19  4:36 AM   Specimen: Nasopharyngeal Swab  Result Value Ref Range Status   SARS Coronavirus 2 NEGATIVE NEGATIVE Final    Comment: (NOTE) SARS-CoV-2 target nucleic acids are NOT DETECTED. The SARS-CoV-2 RNA is generally detectable in upper and lower respiratory specimens during the acute phase of infection. Negative results do not preclude SARS-CoV-2 infection, do not  rule out co-infections with other pathogens, and should not be used as the sole basis for treatment or other patient management decisions. Negative results must be combined with clinical observations, patient history, and epidemiological information. The expected result is Negative. Fact Sheet for Patients: HairSlick.no Fact Sheet for Healthcare Providers: quierodirigir.com This test is not yet approved or cleared by the [place] FDA and  has been authorized for detection and/or diagnosis of SARS-CoV-2 by FDA under an Emergency Use  Authorization (EUA). This EUA will remain  in effect (meaning this test can be used) for the duration of the COVID-19 declaration under Section 56 4(b)(1) of the Act, 21 U.S.C. section 360bbb-3(b)(1), unless the authorization is terminated or revoked sooner. Performed at [facility], [address], [place], [place] [address]     Labs: BNP (last 3 results) No results for input(s): BNP in the last 8760 hours. Basic Metabolic Panel: Recent Labs  Lab 10/20/19 1852 10/21/19 0752 10/22/19 0327  NA 138 140 141  K 3.7 3.8 3.8  CL 104 108 105  CO2 GLUCOSE 100* 75 100*  BUN CREATININE 0.93 0.92 0.92  CALCIUM 8.8* 7.8* 8.3*  MG  --  1.7 1.9  PHOS  --  3.2 3.1   Liver Function Tests: Recent Labs  Lab 10/20/19 1852 10/21/19 0752 10/22/19 0327  AST 256* 157* 98*  ALT 105* 79* 68*  ALKPHOS 69 49 50  BILITOT 0.6 0.9 0.9  PROT 7.3 5.2* 5.4*  ALBUMIN 4.5 3.3* 3.3*   No results for input(s): LIPASE, AMYLASE in the last 168 hours. No results for input(s): AMMONIA in the last 168 hours. CBC: Recent Labs  Lab 10/20/19 1852 10/21/19 0752 10/21/19 1128 10/22/19 0327  WBC 6.9 3.9* 5.4 5.0  NEUTROABS 5.5  --  3.6 2.7  HGB 14.5 14.0 14.0 13.7  HCT 43.7 42.3 40.2 40.2  MCV 98.0 100.2* 95.3 97.3  PLT 231 41* 222 229   Cardiac Enzymes: Recent Labs  Lab 10/21/19 0517 10/22/19 0327 10/22/19 1146  CKTOTAL 6,254* 3,995* 3,256*   BNP: Invalid input(s): POCBNP CBG: No results for input(s): GLUCAP in the last 168 hours. D-Dimer No results for input(s): DDIMER in the last 72 hours. Hgb A1c Recent Labs    10/21/19 0516  HGBA1C 4.8   Lipid Profile Recent Labs    10/21/19 0516  CHOL 149  HDL 88  LDLCALC 53  TRIG 40  CHOLHDL 1.7   Thyroid function studies No results for input(s): TSH, T4TOTAL, T3FREE, THYROIDAB in the last 72 hours.  Invalid input(s): FREET3 Anemia work up No results for input(s): VITAMINB12, FOLATE, FERRITIN, TIBC, IRON,  RETICCTPCT in the last 72 hours. Urinalysis    Component Value Date/Time   COLORURINE YELLOW 10/20/2019 1839   APPEARANCEUR CLEAR 10/20/2019 1839   LABSPEC >1.030 (H) 10/20/2019 1839   PHURINE 5.5 10/20/2019 1839   GLUCOSEU NEGATIVE 10/20/2019 1839   HGBUR LARGE (A) 10/20/2019 1839   BILIRUBINUR NEGATIVE 10/20/2019 1839   KETONESUR NEGATIVE 10/20/2019 1839   PROTEINUR 30 (A) 10/20/2019 1839   NITRITE NEGATIVE 10/20/2019 1839   LEUKOCYTESUR NEGATIVE 10/20/2019 1839   Sepsis Labs Invalid input(s): PROCALCITONIN,  WBC,  LACTICIDVEN Microbiology Recent Results (from the past 240 hour(s))  SARS CORONAVIRUS 2 (TAT 6-24 HRS) Nasopharyngeal Nasopharyngeal Swab     Status: None   Collection Time: 10/21/19  4:36 AM   Specimen: Nasopharyngeal Swab  Result Value Ref Range Status   SARS  Coronavirus 2 NEGATIVE NEGATIVE Final    Comment: (NOTE) SARS-CoV-2 target nucleic acids are NOT DETECTED. The SARS-CoV-2 RNA is generally detectable in upper and lower respiratory specimens during the acute phase of infection. Negative results do not preclude SARS-CoV-2 infection, do not rule out co-infections with other pathogens, and should not be used as the sole basis for treatment or other patient management decisions. Negative results must be combined with clinical observations, patient history, and epidemiological information. The expected result is Negative. Fact Sheet for Patients: HairSlick.nohttps://www.fda.gov/media/138098/download Fact Sheet for Healthcare Providers: quierodirigir.comhttps://www.fda.gov/media/138095/download This test is not yet approved or cleared by the [place] FDA and  has been authorized for detection and/or diagnosis of SARS-CoV-2 by FDA under an Emergency Use Authorization (EUA). This EUA will remain  in effect (meaning this test can be used) for the duration of the COVID-19 declaration under Section 56 4(b)(1) of the Act, 21 U.S.C. section 360bbb-3(b)(1), unless the authorization is  terminated or revoked sooner. Performed at [facility], [address] [place], [place] [address]    Time coordinating discharge: 35 minutes  SIGNED:  Merlene Laughtermair Latif , DO [facility] 10/22/2019, 6:57 PM Pager is on AMION  If 7PM-7AM, please contact night-coverage [email or web address] Password [email or web address]

## 2019-10-22 NOTE — TOC Transition Note (Signed)
Transition of Care [facility]) - CM/SW Discharge Note   Patient Details  Name: Jason Ryan MRN: [id number] Date of Birth: 15-May-1981  [facility] of [facility] CM/SW Contact:  Pollie Friar, RN Phone Number: 10/22/2019, 2:37 PM   Clinical Narrative:    Pt discharging home with outpatient therapy. Pt asked to attend outpatient therapy in [facility]. Orders faxed to [facility] outpatient therapy. CM also provided pt the financial assistance form for the therapy. Pt's discharge meds sent to [facility]. CM provided MATCH to cover the cost. Meds to be delivered to the room. Pt has transportation home.    Final next level of care: OP Rehab Barriers to Discharge: Inadequate or no insurance, Barriers Unresolved (comment)   Patient Goals and CMS Choice     Choice offered to / list presented to : Patient  Discharge Placement                       Discharge Plan and Services   Discharge Planning Services: CM Consult Post Acute Care Choice: Durable Medical Equipment                               Social Determinants of Health (SDOH) Interventions     Readmission Risk Interventions No flowsheet data found.

## 2019-10-22 NOTE — Progress Notes (Signed)
Physical Therapy Treatment and Discharge Patient Details Name: Jason Ryan MRN: [id number] DOB: 1981/08/13 Today's Date: 10/22/2019    History of Present Illness Pt is a 38 y.o. M with significant PMH of polysubstance abuse including cocaine, alcohol and tobacco who presents with difficulty moving RUE. MRI showing acute stroke involving left globus pallidus.     PT Comments    Pt making excellent progress towards physical therapy goals. Ambulating hallway distances with no assistive device and performing static and dynamic high level balance activities independently. Also demonstrates improvements in right upper extremity strength and sensation. D/c plan remains appropriate. No further PT needs; PT signing off.     Follow Up Recommendations  Outpatient PT     Equipment Recommendations  None recommended by PT    Recommendations for Other Services       Precautions / Restrictions Precautions Precautions: None Restrictions Weight Bearing Restrictions: No    Mobility  Bed Mobility Overal bed mobility: Independent                Transfers Overall transfer level: Independent                  Ambulation/Gait Ambulation/Gait assistance: Independent Gait Distance (Feet): 500 Feet Assistive device: None Gait Pattern/deviations: WFL(Within Functional Limits)     General Gait Details: No apparent gait deficits, improved speed and balance   Stairs             Wheelchair Mobility    Modified Rankin (Stroke Patients Only) Modified Rankin (Stroke Patients Only) Pre-Morbid Rankin Score: No symptoms Modified Rankin: Moderate disability     Balance Overall balance assessment: Needs assistance Sitting-balance support: Feet supported Sitting balance-Leahy Scale: Normal     Standing balance support: No upper extremity supported;During functional activity Standing balance-Leahy Scale: Normal   Single Leg Stance - Right Leg: 10 Single Leg Stance -  Left Leg: 10 Tandem Stance - Right Leg: 10 Tandem Stance - Left Leg: 10 Rhomberg - Eyes Opened: 10                  Cognition Arousal/Alertness: Awake/alert Behavior During Therapy: WFL for tasks assessed/performed Overall Cognitive Status: Within Functional Limits for tasks assessed                                        Exercises Other Exercises Other Exercises: Balance: backwards walking, grapevine to both sides, throwing object with RUE in static standing and while walking forwards    General Comments        Pertinent Vitals/Pain Pain Assessment: No/denies pain    Home Living                      Prior Function            PT Goals (current goals can now be found in the care plan section) Acute Rehab PT Goals Patient Stated Goal: get strength back in arm and going home Potential to Achieve Goals: Good Progress towards PT goals: Goals met and updated - see care plan    Frequency    Min 4X/week      PT Plan Other (comment)(d/c therapies)    Co-evaluation              AM-PAC PT "6 Clicks" Mobility   Outcome Measure  Help needed turning from your back to your side while in  a flat bed without using bedrails?: None Help needed moving from lying on your back to sitting on the side of a flat bed without using bedrails?: None Help needed moving to and from a bed to a chair (including a wheelchair)?: None Help needed standing up from a chair using your arms (e.g., wheelchair or bedside chair)?: None Help needed to walk in hospital room?: None Help needed climbing 3-5 steps with a railing? : None 6 Click Score: 24    End of Session Equipment Utilized During Treatment: Gait belt Activity Tolerance: Patient tolerated treatment well Patient left: in bed;with call bell/phone within reach Nurse Communication: Mobility status PT Visit Diagnosis: Unsteadiness on feet (R26.81);Other symptoms and signs involving the nervous system  (R29.898)     Time: 7588-3254 PT Time Calculation (min) (ACUTE ONLY): 12 min  Charges:  $Therapeutic Activity: 8-22 mins                     Ellamae Sia, PT, DPT Acute Rehabilitation Services Pager [phone] Office [phone]    Willy Eddy 10/22/2019, 2:24 PM

## 2019-10-22 NOTE — Progress Notes (Signed)
Occupational Therapy Treatment Patient Details Name: Sander Remedios MRN: [id number] DOB: 10/24/1981 Today's Date: 10/22/2019    History of present illness Pt is a 38 y.o. M with significant PMH of polysubstance abuse including cocaine, alcohol and tobacco who presents with difficulty moving RUE. MRI showing acute stroke involving left globus pallidus.    OT comments  Pt making excellent progress towards occupational therapy goals this session. Pt reports he has less numbness in R UE and only experiencing that feeling in finger tips. Pt has reviewed handout for [facility] and has no questions. OT discussed several other options based on leisure interests and every day tasks. OT provided pt with paper handout for UE strengthening HEP with use of level 1 theraband. Pt returns demonstrations for alternating punches, chest pulls, shoulder diagonals, shoulder elevation, and bicep curls with min cuing for technique. Pt remained in bed at end of session and reports no further concerns at this time. Pt would continue to benefit from OT intervention in outpatient setting.   Follow Up Recommendations  Outpatient OT    Equipment Recommendations  None recommended by OT       Precautions / Restrictions Precautions Precautions: None       Mobility Bed Mobility Overal bed mobility: Independent        Transfers Overall transfer level: Independent        Balance Overall balance assessment: Needs assistance Sitting-balance support: Feet supported Sitting balance-Leahy Scale: Normal        ADL either performed or assessed with clinical judgement        Vision Baseline Vision/History: No visual deficits            Cognition Arousal/Alertness: Awake/alert Behavior During Therapy: WFL for tasks assessed/performed Overall Cognitive Status: Within Functional Limits for tasks assessed                        Pertinent Vitals/ Pain       Pain Assessment: No/denies pain          Frequency  Min 3X/week        Progress Toward Goals  OT Goals(current goals can now be found in the care plan section)  Progress towards OT goals: Progressing toward goals  Acute Rehab OT Goals Patient Stated Goal: get strength back in arm and going home OT Goal Formulation: With patient Time For Goal Achievement: 11/05/19 Potential to Achieve Goals: Good  Plan Discharge plan remains appropriate       AM-PAC OT "6 Clicks" Daily Activity     Outcome Measure   Help from another person eating meals?: A Little Help from another person taking care of personal grooming?: A Little Help from another person toileting, which includes using toliet, bedpan, or urinal?: None Help from another person bathing (including washing, rinsing, drying)?: None Help from another person to put on and taking off regular upper body clothing?: A Little Help from another person to put on and taking off regular lower body clothing?: None 6 Click Score: 21    End of Session    OT Visit Diagnosis: Hemiplegia and hemiparesis Hemiplegia - Right/Left: Right Hemiplegia - dominant/non-dominant: Dominant Hemiplegia - caused by: Cerebral infarction   Activity Tolerance Patient tolerated treatment well   Patient Left in bed;with call bell/phone within reach;with family/visitor present;Other (comment)           Time: 6294-7654 OT Time Calculation (min): 17 min  Charges: OT General Charges $OT Visit: 1 Visit OT Treatments $Neuromuscular  Re-education: 8-22 mins  Gypsy Decant MS, OTR/L 10/22/2019, 2:18 PM

## 2019-10-23 LAB — VITAMIN B1: Vitamin B1 (Thiamine): 110.6 nmol/L (ref 66.5–200.0)

## 2019-10-24 LAB — VITAMIN B6: Vitamin B6: 8.5 ug/L (ref 5.3–46.7)

## 2019-10-28 LAB — GC/CHLAMYDIA PROBE AMP (~~LOC~~) NOT AT ARMC
Chlamydia: NEGATIVE
Neisseria Gonorrhea: NEGATIVE

## 2019-11-30 ENCOUNTER — Telehealth: Payer: Self-pay | Admitting: Adult Health

## 2019-11-30 NOTE — Telephone Encounter (Signed)
Unable to contact to convert appt to mychart due to NP being out- VM full

## 2019-12-03 ENCOUNTER — Inpatient Hospital Stay: Payer: Self-pay | Admitting: Adult Health

## 2019-12-07 ENCOUNTER — Ambulatory Visit: Payer: Self-pay | Admitting: Internal Medicine

## 2020-08-16 ENCOUNTER — Other Ambulatory Visit: Payer: Self-pay

## 2020-08-16 ENCOUNTER — Encounter (HOSPITAL_BASED_OUTPATIENT_CLINIC_OR_DEPARTMENT_OTHER): Payer: Self-pay | Admitting: Emergency Medicine

## 2020-08-16 ENCOUNTER — Emergency Department (HOSPITAL_BASED_OUTPATIENT_CLINIC_OR_DEPARTMENT_OTHER)
Admission: EM | Admit: 2020-08-16 | Discharge: 2020-08-16 | Disposition: A | Discharge status: Home / Self Care | Payer: 59 | Attending: Emergency Medicine | Admitting: Emergency Medicine

## 2020-08-16 DIAGNOSIS — F17210 Nicotine dependence, cigarettes, uncomplicated: Secondary | ICD-10-CM | POA: Insufficient documentation

## 2020-08-16 DIAGNOSIS — R369 Urethral discharge, unspecified: Secondary | ICD-10-CM | POA: Diagnosis present

## 2020-08-16 DIAGNOSIS — Z8673 Personal history of transient ischemic attack (TIA), and cerebral infarction without residual deficits: Secondary | ICD-10-CM | POA: Insufficient documentation

## 2020-08-16 DIAGNOSIS — N342 Other urethritis: Secondary | ICD-10-CM

## 2020-08-16 DIAGNOSIS — N341 Nonspecific urethritis: Secondary | ICD-10-CM | POA: Diagnosis not present

## 2020-08-16 DIAGNOSIS — Z7982 Long term (current) use of aspirin: Secondary | ICD-10-CM | POA: Insufficient documentation

## 2020-08-16 LAB — URINALYSIS, ROUTINE W REFLEX MICROSCOPIC
Bilirubin Urine: NEGATIVE
Glucose, UA: NEGATIVE mg/dL
Hgb urine dipstick: NEGATIVE
Ketones, ur: NEGATIVE mg/dL
Leukocytes,Ua: NEGATIVE
Nitrite: NEGATIVE
Protein, ur: NEGATIVE mg/dL
Specific Gravity, Urine: 1.01 (ref 1.005–1.030)
pH: 6 (ref 5.0–8.0)

## 2020-08-16 MED ORDER — AZITHROMYCIN 250 MG PO TABS
1000.0000 mg | ORAL_TABLET | Freq: Once | ORAL | Status: AC
  Administered 2020-08-16: 1000 mg via ORAL
  Filled 2020-08-16: qty 4

## 2020-08-16 MED ORDER — CEFTRIAXONE SODIUM 500 MG IJ SOLR
500.0000 mg | Freq: Once | INTRAMUSCULAR | Status: AC
  Administered 2020-08-16: 500 mg via INTRAMUSCULAR
  Filled 2020-08-16: qty 500

## 2020-08-16 NOTE — ED Triage Notes (Signed)
Penile discharge for 1 month. States had unprotected sex recently. Denies dysuria.

## 2020-08-16 NOTE — ED Provider Notes (Signed)
[facility] EMERGENCY DEPARTMENT Provider Note   CSN: [id number] Arrival date & time: 08/16/20  0125     History Chief Complaint  Patient presents with  . Penile Discharge    Jason Ryan is a 39 y.o. male.  Patient presents to the emergency department with a chief complaint of penile discharge.  He states that he had unprotected sex this month.  He has been having white to clear penile discharge.  He denies any fevers.  Reports some urinary frequency.  Denies any other associated symptoms.  States that he usually comes here and gets treated and feels better.  The history is provided by the patient. No language interpreter was used.       History reviewed. No pertinent past medical history.  Patient Active Problem List   Diagnosis Date Noted  . Acute CVA (cerebrovascular accident) (HCC) 10/21/2019  . Cocaine abuse (HCC) 10/21/2019  . Elevated LFTs 10/21/2019    History reviewed. No pertinent surgical history.     Family History  Family history unknown: Yes    Social History   Tobacco Use  . Smoking status: Current Every Day Smoker    Types: Cigarettes  . Smokeless tobacco: Never Used  Vaping Use  . Vaping Use: Never used  Substance Use Topics  . Alcohol use: Yes    Comment: occ  . Drug use: Yes    Types: Cocaine    Home Medications Prior to Admission medications   Medication Sig Start Date End Date Taking? Authorizing Provider  aspirin 81 MG EC tablet Take 1 tablet (81 mg total) by mouth daily. 10/23/19   Marguerita Merles Latif, DO  clopidogrel (PLAVIX) 75 MG tablet Take 1 tablet (75 mg total) by mouth daily. 10/23/19   Marguerita Merles Latif, DO  folic acid (FOLVITE) 1 MG tablet Take 1 tablet (1 mg total) by mouth daily. 10/23/19   Marguerita Merles Latif, DO  Multiple Vitamin (MULTIVITAMIN WITH MINERALS) TABS tablet Take 1 tablet by mouth daily. 10/23/19   Marguerita Merles Latif, DO  thiamine 100 MG tablet Take 1 tablet (100 mg total) by mouth daily. 10/23/19    Marguerita Merles Latif, DO    Allergies    Pork-derived products  Review of Systems   Review of Systems  All other systems reviewed and are negative.   Physical Exam Updated Vital Signs BP 115/89 (BP Location: Right Arm)   Pulse 78   Temp 98.6 F (37 C) (Oral)   Resp 20   Ht 5\' 10"  (1.778 m)   Wt 59 kg   SpO2 99%   BMI 18.65 kg/m   Physical Exam Vitals and nursing note reviewed.  Constitutional:      General: He is not in acute distress.    Appearance: He is well-developed. He is not ill-appearing.  HENT:     Head: Normocephalic and atraumatic.  Eyes:     Conjunctiva/sclera: Conjunctivae normal.  Cardiovascular:     Rate and Rhythm: Normal rate.  Pulmonary:     Effort: Pulmonary effort is normal. No respiratory distress.  Abdominal:     General: There is no distension.  Musculoskeletal:     Cervical back: Neck supple.     Comments: Moves all extremities  Skin:    General: Skin is warm and dry.  Neurological:     Mental Status: He is alert and oriented to person, place, and time.  Psychiatric:        Mood and Affect: Mood normal.  Behavior: Behavior normal.     ED Results / Procedures / Treatments   Labs (all labs ordered are listed, but only abnormal results are displayed) Labs Reviewed  URINALYSIS, ROUTINE W REFLEX MICROSCOPIC  GC/CHLAMYDIA PROBE AMP ([place]) NOT AT [facility]    EKG None  Radiology No results found.  Procedures Procedures (including critical care time)  Medications Ordered in ED Medications  cefTRIAXone (ROCEPHIN) injection 500 mg (has no administration in time range)  azithromycin (ZITHROMAX) tablet 1,000 mg (has no administration in time range)    ED Course  I have reviewed the triage vital signs and the nursing notes.  Pertinent labs & imaging results that were available during my care of the patient were reviewed by me and considered in my medical decision making (see chart for details).    MDM  Rules/Calculators/A&P                          Patient with penile discharge.  Reports unprotected sex.  Hx of the same. Final Clinical Impression(s) / ED Diagnoses Final diagnoses:  Urethritis    Rx / DC Orders ED Discharge Orders    None       Roxy Horseman, PA-C 08/16/20 0354    Mesner, Barbara Cower, MD 08/16/20 [phone]

## 2021-02-07 IMAGING — CR DG ELBOW COMPLETE 3+V*R*
4 series · 4 of 4 positions shown · non-contrast
Comparison: None.

CLINICAL DATA: Right elbow pain.  Right arm numbness.

EXAM:
RIGHT ELBOW - COMPLETE 3+ VIEW

[x elbow joint ap right]
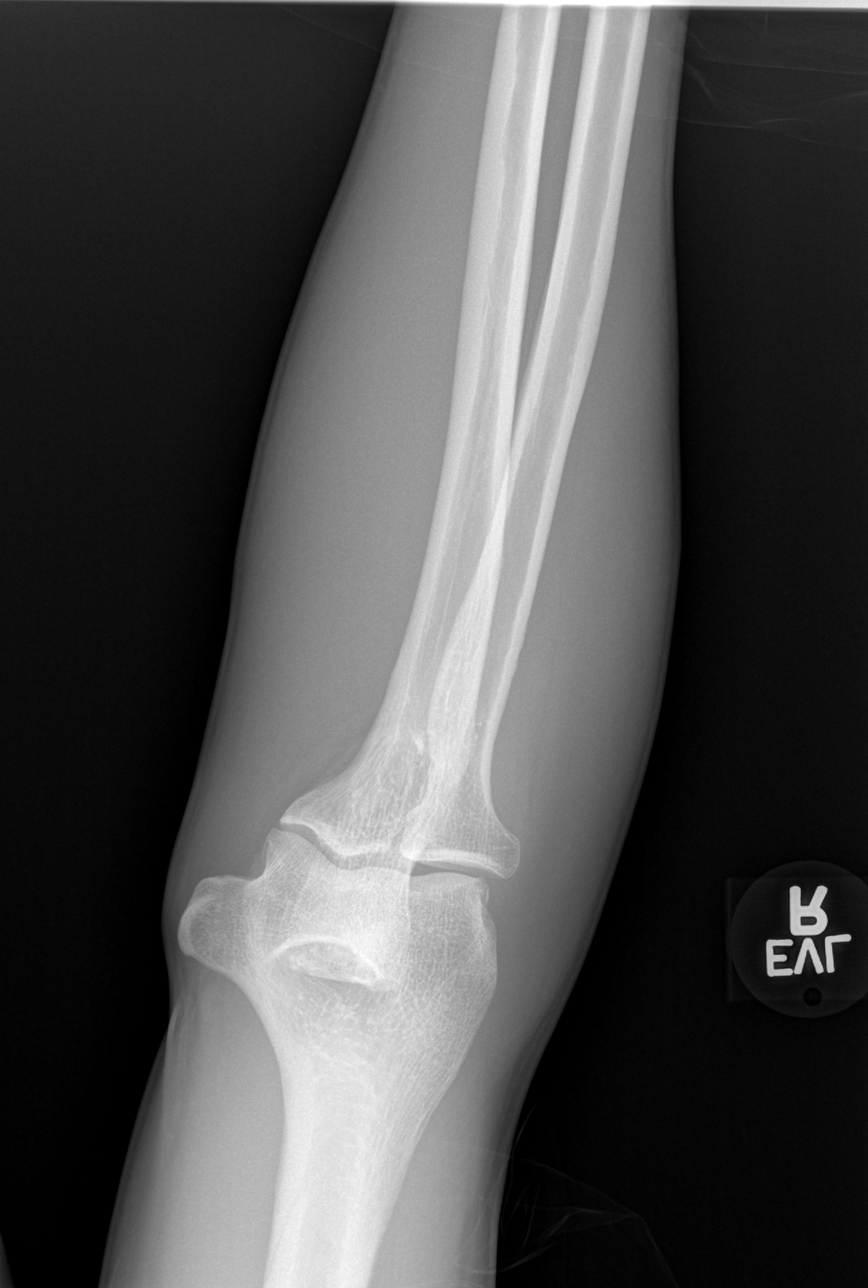

[x elbow joint obl. right (1 of 3)]
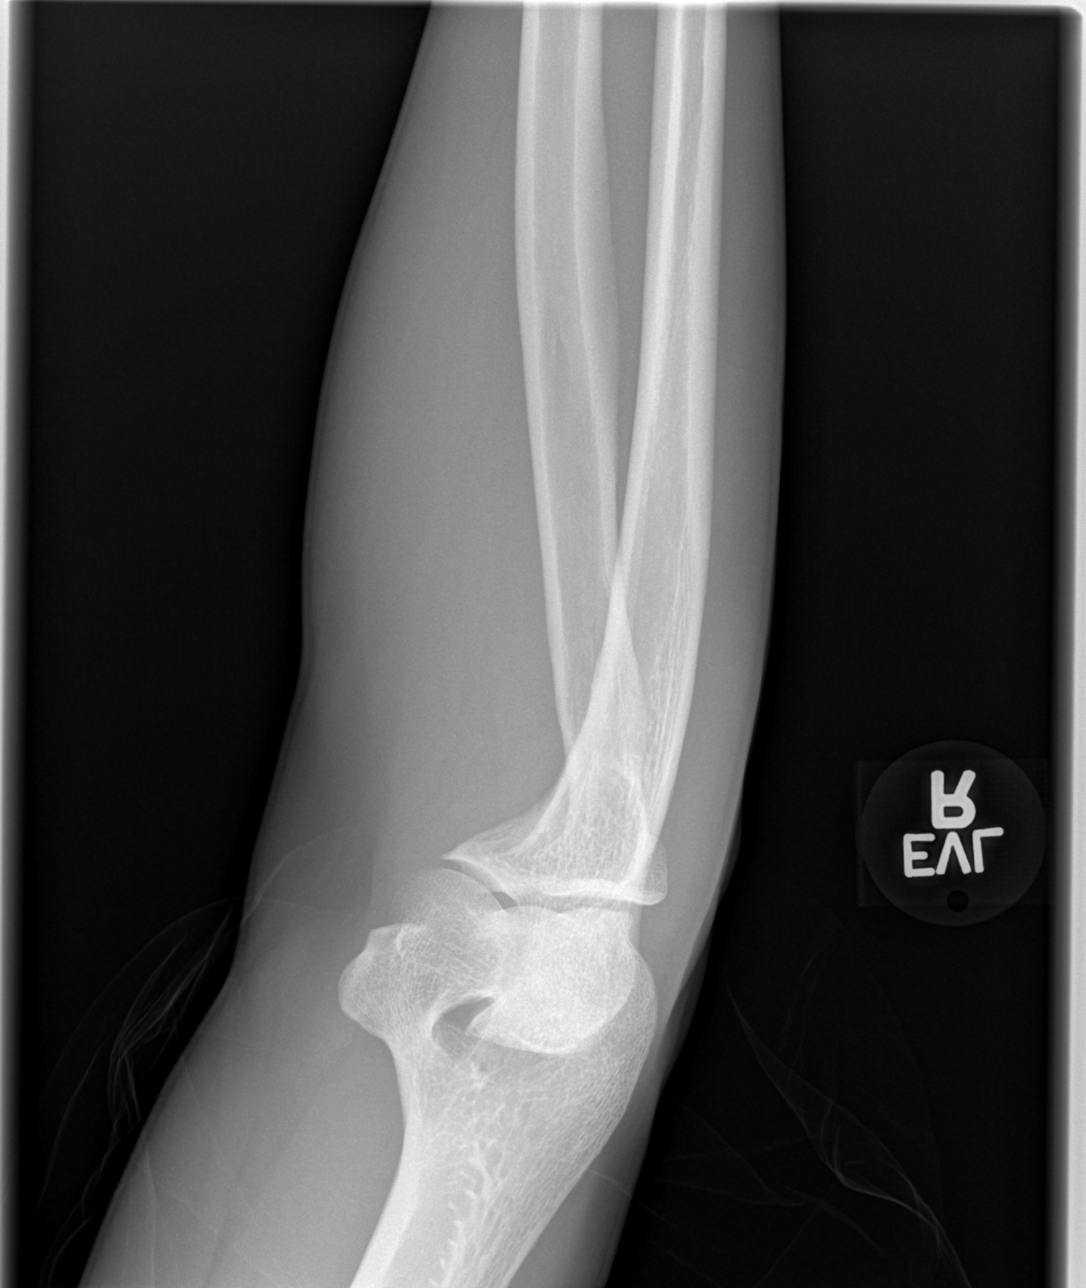

[x elbow joint obl. right (2 of 3)]
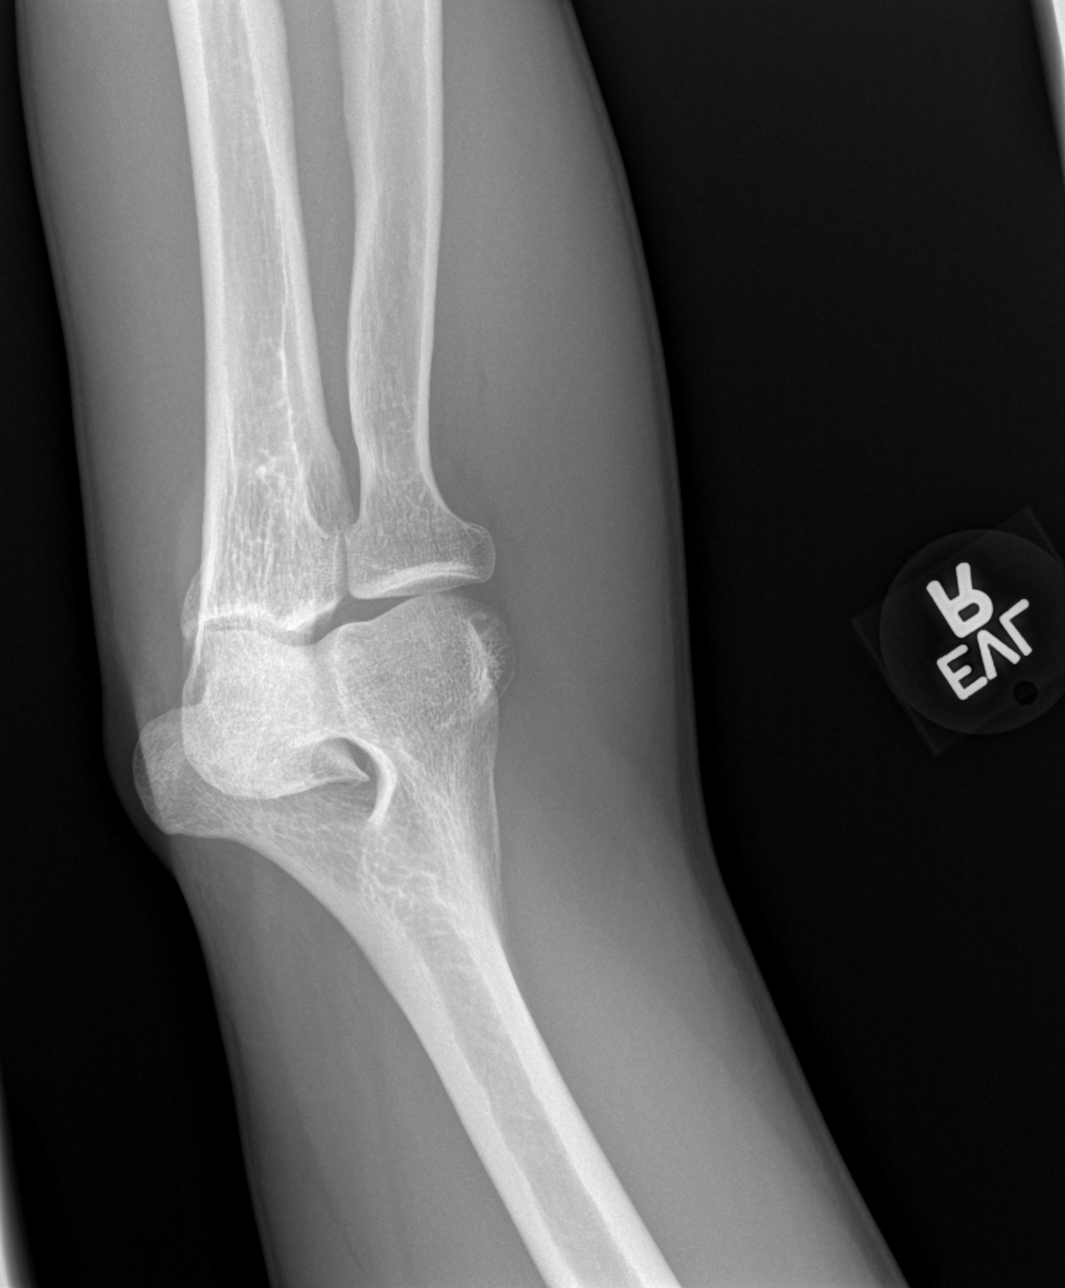

[x elbow joint obl. right (3 of 3)]
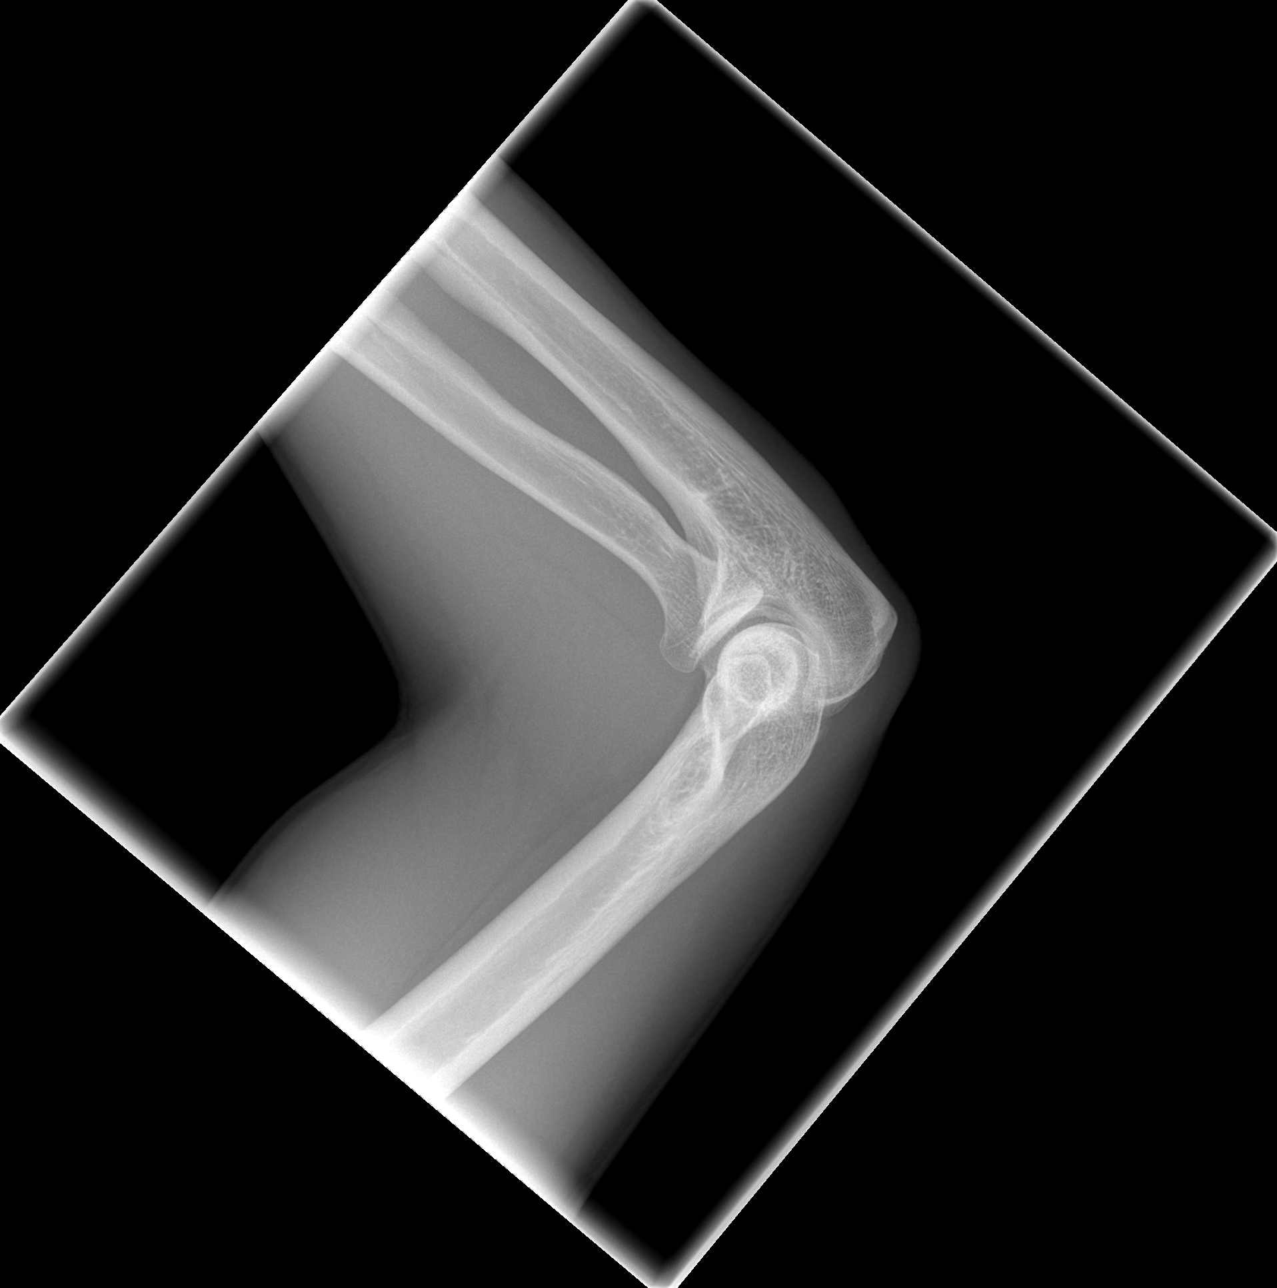

[4 of 4 positions shown; findings below may reference images not displayed]

FINDINGS: There is no evidence of fracture, dislocation, or joint effusion.
There is no evidence of arthropathy or other focal bone abnormality.
Soft tissues are unremarkable.
IMPRESSION: Negative radiographs of the right elbow.

## 2021-07-08 ENCOUNTER — Encounter (HOSPITAL_BASED_OUTPATIENT_CLINIC_OR_DEPARTMENT_OTHER): Payer: Self-pay | Admitting: Emergency Medicine

## 2021-07-08 ENCOUNTER — Emergency Department (HOSPITAL_BASED_OUTPATIENT_CLINIC_OR_DEPARTMENT_OTHER)
Admission: EM | Admit: 2021-07-08 | Discharge: 2021-07-09 | Disposition: A | Discharge status: Home / Self Care | Payer: BLUE CROSS/BLUE SHIELD | Attending: Emergency Medicine | Admitting: Emergency Medicine

## 2021-07-08 ENCOUNTER — Other Ambulatory Visit: Payer: Self-pay

## 2021-07-08 DIAGNOSIS — Z7982 Long term (current) use of aspirin: Secondary | ICD-10-CM | POA: Insufficient documentation

## 2021-07-08 DIAGNOSIS — N342 Other urethritis: Secondary | ICD-10-CM | POA: Diagnosis not present

## 2021-07-08 DIAGNOSIS — F17210 Nicotine dependence, cigarettes, uncomplicated: Secondary | ICD-10-CM | POA: Insufficient documentation

## 2021-07-08 DIAGNOSIS — R369 Urethral discharge, unspecified: Secondary | ICD-10-CM | POA: Diagnosis present

## 2021-07-08 LAB — URINALYSIS, ROUTINE W REFLEX MICROSCOPIC
Bilirubin Urine: NEGATIVE
Glucose, UA: NEGATIVE mg/dL
Hgb urine dipstick: NEGATIVE
Ketones, ur: NEGATIVE mg/dL
Leukocytes,Ua: NEGATIVE
Nitrite: NEGATIVE
Protein, ur: NEGATIVE mg/dL
Specific Gravity, Urine: 1.02 (ref 1.005–1.030)
pH: 6 (ref 5.0–8.0)

## 2021-07-08 NOTE — ED Triage Notes (Signed)
Pt presents to ED PV. Pt c/o penile discharge and inflammation to urethra x1w. Pt reports having unprotected sex.

## 2021-07-09 MED ORDER — AZITHROMYCIN 1 G PO PACK
1.0000 g | PACK | Freq: Once | ORAL | Status: AC
  Administered 2021-07-09: 1 g via ORAL
  Filled 2021-07-09: qty 1

## 2021-07-09 MED ORDER — CEFTRIAXONE SODIUM 500 MG IJ SOLR
500.0000 mg | Freq: Once | INTRAMUSCULAR | Status: AC
  Administered 2021-07-09: 500 mg via INTRAMUSCULAR
  Filled 2021-07-09: qty 500

## 2021-07-09 NOTE — ED Provider Notes (Signed)
[facility] Provider Note: Jason Dell, MD, FACEP  CSN: [id number] MRN: [id number] ARRIVAL: 07/08/21 at 2249 ROOM: MH02/MH02   CHIEF COMPLAINT  Penile Discharge   HISTORY OF PRESENT ILLNESS  07/09/21 12:20 AM Jason Ryan is a 40 y.o. male who has had a urethral discharge for about a week.  There is no associated discomfort or dysuria.  The discharge is white and not profuse.  He denies any systemic symptoms such as fever or chills.  He denies abdominal pain.  He does admit to unprotected sex.  He has had similar symptoms in the past with negative tests for STDs.   History reviewed. No pertinent past medical history.  History reviewed. No pertinent surgical history.  Family History  Family history unknown: Yes    Social History   Tobacco Use   Smoking status: Every Day    Types: Cigarettes   Smokeless tobacco: Never  Vaping Use   Vaping Use: Never used  Substance Use Topics   Alcohol use: Yes    Comment: occ   Drug use: Yes    Types: Cocaine    Prior to Admission medications   Medication Sig Start Date End Date Taking? Authorizing Provider  aspirin 81 MG EC tablet Take 1 tablet (81 mg total) by mouth daily. 10/23/19   Marguerita Merles Latif, DO  clopidogrel (PLAVIX) 75 MG tablet Take 1 tablet (75 mg total) by mouth daily. 10/23/19   Marguerita Merles Latif, DO  folic acid (FOLVITE) 1 MG tablet Take 1 tablet (1 mg total) by mouth daily. 10/23/19   Marguerita Merles Latif, DO  Multiple Vitamin (MULTIVITAMIN WITH MINERALS) TABS tablet Take 1 tablet by mouth daily. 10/23/19   Marguerita Merles Latif, DO  thiamine 100 MG tablet Take 1 tablet (100 mg total) by mouth daily. 10/23/19   Marguerita Merles Latif, DO    Allergies Pork-derived products   REVIEW OF SYSTEMS  Negative except as noted here or in the History of Present Illness.   PHYSICAL EXAMINATION  Initial Vital Signs Blood pressure 122/80, pulse 80, temperature 98.6 F (37 C), temperature source Oral, resp.  rate 18, height 5\' 10"  (1.778 m), weight 63.5 kg, SpO2 100 %.  Examination General: Well-developed, well-nourished male in no acute distress; appearance consistent with age of record HENT: normocephalic; atraumatic Eyes: Normal appearance Neck: supple Heart: regular rate and rhythm Lungs: clear to auscultation bilaterally Abdomen: soft; nondistended; nontender; bowel sounds present GU: Tanner V male, circumcised; small amount of whitish urethral discharge on milking of urethra Extremities: No deformity; full range of motion Neurologic: Awake, alert and oriented; motor function intact in all extremities and symmetric; no facial droop Skin: Warm and dry Psychiatric: Normal mood and affect   RESULTS  Summary of this visit's results, reviewed and interpreted by myself:   EKG Interpretation  Date/Time:    Ventricular Rate:    PR Interval:    QRS Duration:   QT Interval:    QTC Calculation:   R Axis:     Text Interpretation:         Laboratory Studies: Results for orders placed or performed during the hospital encounter of 07/08/21 (from the past 24 hour(s))  Urinalysis, Routine w reflex microscopic     Status: None   Collection Time: 07/08/21 11:12 PM  Result Value Ref Range   Color, Urine YELLOW YELLOW   APPearance CLEAR CLEAR   Specific Gravity, Urine 1.020 1.005 - 1.030   pH 6.0 5.0 - 8.0  Glucose, UA NEGATIVE NEGATIVE mg/dL   Hgb urine dipstick NEGATIVE NEGATIVE   Bilirubin Urine NEGATIVE NEGATIVE   Ketones, ur NEGATIVE NEGATIVE mg/dL   Protein, ur NEGATIVE NEGATIVE mg/dL   Nitrite NEGATIVE NEGATIVE   Leukocytes,Ua NEGATIVE NEGATIVE   Imaging Studies: No results found.  ED COURSE and MDM  Nursing notes, initial and subsequent vitals signs, including pulse oximetry, reviewed and interpreted by myself.  Vitals:   07/08/21 2304  BP: 122/80  Pulse: 80  Resp: 18  Temp: 98.6 F (37 C)  TempSrc: Oral  SpO2: 100%  Weight: 63.5 kg  Height: 5\' 10"  (1.778 m)    Medications  cefTRIAXone (ROCEPHIN) injection 500 mg (has no administration in time range)  azithromycin (ZITHROMAX) powder 1 g (has no administration in time range)    Patient's symptoms are concerning for urethritis, more consistent with chlamydia than gonorrhea but we will treat for both.  Zithromax chosen for chlamydia treatment due to compliance concerns.  PROCEDURES  Procedures   ED DIAGNOSES     ICD-10-CM   1. Urethritis  N34.2          Dameer Speiser, MD 07/09/21 [phone]

## 2021-07-09 NOTE — ED Notes (Signed)
Patient sitting on chair with ABCs intact, alert and oriented x4, respirations even and unlabored. Patient states has penile discharge x 1 month. Symptoms getting worse. Denies fever. Call bell in reach.

## 2021-07-10 LAB — GC/CHLAMYDIA PROBE AMP (~~LOC~~) NOT AT ARMC
Chlamydia: NEGATIVE
Comment: NEGATIVE
Comment: NORMAL
Neisseria Gonorrhea: NEGATIVE
